# Patient Record
Sex: Female | Born: 1975 | Race: White | Hispanic: No | Smoking: Light tobacco smoker
Health system: Southern US, Community
[De-identification: ages and names within clinical notes are randomized; demographics above are authoritative.]

## PROBLEM LIST (undated history)

## (undated) DIAGNOSIS — R251 Tremor, unspecified: Secondary | ICD-10-CM

## (undated) DIAGNOSIS — N809 Endometriosis, unspecified: Secondary | ICD-10-CM

## (undated) DIAGNOSIS — T7840XA Allergy, unspecified, initial encounter: Secondary | ICD-10-CM

## (undated) DIAGNOSIS — F329 Major depressive disorder, single episode, unspecified: Secondary | ICD-10-CM

## (undated) DIAGNOSIS — A1801 Tuberculosis of spine: Secondary | ICD-10-CM

## (undated) DIAGNOSIS — Q7962 Hypermobile Ehlers-Danlos syndrome: Secondary | ICD-10-CM

## (undated) DIAGNOSIS — E282 Polycystic ovarian syndrome: Secondary | ICD-10-CM

## (undated) DIAGNOSIS — G43909 Migraine, unspecified, not intractable, without status migrainosus: Secondary | ICD-10-CM

## (undated) DIAGNOSIS — G4730 Sleep apnea, unspecified: Secondary | ICD-10-CM

## (undated) DIAGNOSIS — F909 Attention-deficit hyperactivity disorder, unspecified type: Secondary | ICD-10-CM

## (undated) DIAGNOSIS — E039 Hypothyroidism, unspecified: Secondary | ICD-10-CM

## (undated) DIAGNOSIS — L409 Psoriasis, unspecified: Secondary | ICD-10-CM

## (undated) DIAGNOSIS — G935 Compression of brain: Secondary | ICD-10-CM

## (undated) DIAGNOSIS — Z9889 Other specified postprocedural states: Secondary | ICD-10-CM

## (undated) DIAGNOSIS — R112 Nausea with vomiting, unspecified: Secondary | ICD-10-CM

## (undated) DIAGNOSIS — F32A Depression, unspecified: Secondary | ICD-10-CM

## (undated) DIAGNOSIS — I499 Cardiac arrhythmia, unspecified: Secondary | ICD-10-CM

## (undated) DIAGNOSIS — K219 Gastro-esophageal reflux disease without esophagitis: Secondary | ICD-10-CM

## (undated) DIAGNOSIS — R4701 Aphasia: Secondary | ICD-10-CM

## (undated) DIAGNOSIS — IMO0001 Reserved for inherently not codable concepts without codable children: Secondary | ICD-10-CM

## (undated) HISTORY — DX: Attention-deficit hyperactivity disorder, unspecified type: F90.9

## (undated) HISTORY — DX: Psoriasis, unspecified: L40.9

## (undated) HISTORY — DX: Endometriosis, unspecified: N80.9

## (undated) HISTORY — DX: Hypothyroidism, unspecified: E03.9

## (undated) HISTORY — PX: INNER EAR SURGERY: SHX679

## (undated) HISTORY — DX: Allergy, unspecified, initial encounter: T78.40XA

## (undated) HISTORY — PX: OTHER SURGICAL HISTORY: SHX169

## (undated) HISTORY — DX: Major depressive disorder, single episode, unspecified: F32.9

## (undated) HISTORY — DX: Depression, unspecified: F32.A

## (undated) HISTORY — PX: CRANIECTOMY: SHX331

## (undated) HISTORY — DX: Polycystic ovarian syndrome: E28.2

## (undated) HISTORY — PX: MYRINGOTOMY: SUR874

## (undated) HISTORY — DX: Migraine, unspecified, not intractable, without status migrainosus: G43.909

---
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
## 2000-05-03 ENCOUNTER — Emergency Department (HOSPITAL_COMMUNITY): Admission: EM | Admit: 2000-05-03 | Discharge: 2000-05-03 | Payer: Self-pay | Admitting: Emergency Medicine

## 2002-06-04 ENCOUNTER — Encounter: Payer: Self-pay | Admitting: Emergency Medicine

## 2002-06-04 ENCOUNTER — Emergency Department (HOSPITAL_COMMUNITY): Admission: EM | Admit: 2002-06-04 | Discharge: 2002-06-04 | Payer: Self-pay | Admitting: Emergency Medicine

## 2002-08-29 ENCOUNTER — Ambulatory Visit (HOSPITAL_COMMUNITY): Admission: RE | Admit: 2002-08-29 | Discharge: 2002-08-29 | Payer: Self-pay | Admitting: Gastroenterology

## 2002-09-27 ENCOUNTER — Other Ambulatory Visit: Admission: RE | Admit: 2002-09-27 | Discharge: 2002-09-27 | Payer: Self-pay | Admitting: *Deleted

## 2004-04-18 ENCOUNTER — Emergency Department (HOSPITAL_COMMUNITY): Admission: EM | Admit: 2004-04-18 | Discharge: 2004-04-18 | Payer: Self-pay | Admitting: Emergency Medicine

## 2005-05-01 ENCOUNTER — Encounter: Admission: RE | Admit: 2005-05-01 | Discharge: 2005-05-01 | Payer: Self-pay | Admitting: Otolaryngology

## 2005-08-04 ENCOUNTER — Encounter (INDEPENDENT_AMBULATORY_CARE_PROVIDER_SITE_OTHER): Payer: Self-pay | Admitting: *Deleted

## 2005-08-04 ENCOUNTER — Ambulatory Visit (HOSPITAL_COMMUNITY): Admission: RE | Admit: 2005-08-04 | Discharge: 2005-08-04 | Payer: Self-pay | Admitting: Gastroenterology

## 2005-09-10 ENCOUNTER — Encounter: Admission: RE | Admit: 2005-09-10 | Discharge: 2005-09-10 | Payer: Self-pay | Admitting: Family Medicine

## 2005-11-08 HISTORY — PX: MASTOIDECTOMY: SHX711

## 2006-02-10 ENCOUNTER — Ambulatory Visit: Payer: Self-pay | Admitting: Professional

## 2006-02-24 ENCOUNTER — Ambulatory Visit: Payer: Self-pay | Admitting: Professional

## 2006-03-03 ENCOUNTER — Ambulatory Visit: Payer: Self-pay | Admitting: Professional

## 2006-03-14 ENCOUNTER — Encounter: Admission: RE | Admit: 2006-03-14 | Discharge: 2006-03-14 | Payer: Self-pay | Admitting: Family Medicine

## 2006-03-17 ENCOUNTER — Ambulatory Visit: Payer: Self-pay | Admitting: Professional

## 2006-03-23 ENCOUNTER — Encounter (INDEPENDENT_AMBULATORY_CARE_PROVIDER_SITE_OTHER): Payer: Self-pay | Admitting: Specialist

## 2006-03-23 ENCOUNTER — Encounter: Admission: RE | Admit: 2006-03-23 | Discharge: 2006-03-23 | Payer: Self-pay | Admitting: Psychiatry

## 2006-03-24 ENCOUNTER — Ambulatory Visit: Payer: Self-pay | Admitting: Professional

## 2006-03-31 ENCOUNTER — Ambulatory Visit: Payer: Self-pay | Admitting: Professional

## 2006-04-21 ENCOUNTER — Ambulatory Visit: Payer: Self-pay | Admitting: Professional

## 2006-05-05 ENCOUNTER — Ambulatory Visit: Payer: Self-pay | Admitting: Professional

## 2006-05-19 ENCOUNTER — Ambulatory Visit: Payer: Self-pay | Admitting: Professional

## 2006-06-02 ENCOUNTER — Ambulatory Visit: Payer: Self-pay | Admitting: Professional

## 2006-06-20 ENCOUNTER — Ambulatory Visit: Payer: Self-pay | Admitting: Professional

## 2006-06-29 ENCOUNTER — Encounter: Admission: RE | Admit: 2006-06-29 | Discharge: 2006-06-29 | Payer: Self-pay | Admitting: Family Medicine

## 2006-07-21 ENCOUNTER — Ambulatory Visit: Payer: Self-pay | Admitting: Professional

## 2006-08-04 ENCOUNTER — Ambulatory Visit: Payer: Self-pay | Admitting: Professional

## 2006-08-18 ENCOUNTER — Ambulatory Visit: Payer: Self-pay | Admitting: Professional

## 2006-11-28 ENCOUNTER — Ambulatory Visit (HOSPITAL_COMMUNITY): Admission: RE | Admit: 2006-11-28 | Discharge: 2006-11-28 | Payer: Self-pay | Admitting: Gastroenterology

## 2007-02-24 ENCOUNTER — Encounter: Admission: RE | Admit: 2007-02-24 | Discharge: 2007-02-24 | Payer: Self-pay | Admitting: Family Medicine

## 2008-11-08 HISTORY — PX: MASTECTOMY, PARTIAL: SHX709

## 2008-11-08 HISTORY — PX: BREAST LUMPECTOMY: SHX2

## 2010-02-13 ENCOUNTER — Encounter: Admission: RE | Admit: 2010-02-13 | Discharge: 2010-02-13 | Payer: Self-pay | Admitting: Otolaryngology

## 2011-03-26 NOTE — Op Note (Signed)
   NAME:  Gina Golden, Gina Golden                          ACCOUNT NO.:  [id number]   MEDICAL RECORD NO.:  [id number]                   PATIENT TYPE:  AMB   LOCATION:  ENDO                                 FACILITY:  [facility]   PHYSICIAN:  Anselmo Rod, M.D.               DATE OF BIRTH:  1976/01/13   DATE OF PROCEDURE:  08/30/2002  DATE OF DISCHARGE:                                 OPERATIVE REPORT   PROCEDURE PERFORMED:  Screening colonoscopy.   ENDOSCOPIST:  Charna Elizabeth, M.D.   INSTRUMENT USED:  Olympus video colonoscope.   INDICATIONS FOR PROCEDURE:  The patient is a 35 year old white female with a  history of rectal bleeding, blood in mucus and stool, diarrhea and family  history of colon cancer in maternal great aunt.  Rule out inflammatory bowel  disease, polyps, etc.   PREPROCEDURE PREPARATION:  Informed consent was procured from the patient.  The patient was fasted for eight hours prior to the procedure and prepped  with a bottle of magnesium citrate and a gallon of NuLytely the night prior  to the procedure.   PREPROCEDURE PHYSICAL:  The patient had stable vital signs. Neck supple.  Chest clear to auscultation.  S1 and S2 regular.  Abdomen soft with normal  bowel sounds.   DESCRIPTION OF PROCEDURE:  The patient was placed in left lateral decubitus  position and sedated with 100 mg of Demerol and 10 mg of Versed  intravenously.  Once the patient was adequately sedated and maintained on  low flow oxygen and continuous cardiac monitoring, the Olympus video  colonoscope was advanced from the rectum to the cecum and terminal ileum  without difficulty.  The patient had a fairly good prep, no masses, polyps,  erosions, ulcerations or diverticula seen.  There was no evidence of  inflammatory bowel disease.  The appendicular orifice and ileocecal valve  were clearly visualized and photographed.  The terminal ileum appeared  normal as well.   IMPRESSION:  Normal colonoscopy to the  terminal ileum.    RECOMMENDATIONS:  1. A high fiber diet has been recommended to the patient.  2. Outpatient follow-up is advised in the next two weeks.  A small bowel     follow-through will be done if the patient's symptoms persist.  Further     recommendations will be made thereafter.                                                  Anselmo Rod, M.D.    JNM/MEDQ  D:  08/30/2002  T:  08/30/2002  Job:  [id number]   cc:   Gabriel Earing, MD  [address]  [place]  [place] [address]  Fax: [phone]

## 2011-03-26 NOTE — Op Note (Signed)
Gina Golden, Gina Golden                ACCOUNT NO.:  [id number]   MEDICAL RECORD NO.:  [id number]          PATIENT TYPE:  AMB   LOCATION:  ENDO                         FACILITY:  [facility]   PHYSICIAN:  Anselmo Rod, M.D.  DATE OF BIRTH:  04/03/1976   DATE OF PROCEDURE:  08/04/2005  DATE OF DISCHARGE:                                 OPERATIVE REPORT   PROCEDURE PERFORMED:  Esophagogastroduodenoscopy with antral biopsies.   ENDOSCOPIST:  Charna Elizabeth, M.D.   INSTRUMENT USED:  Olympus video panendoscope.   INDICATIONS FOR PROCEDURE:  The patient is a 35 year old white female with a  history of multiple medical issues including PCOS, anxiety and depression,  undergoing esophagogastroduodenoscopy for nausea and abdominal pain.  Patient was found to have guaiac positive stools in the office rule out  peptic ulcer disease.   PREPROCEDURE PREPARATION:  Informed consent was procured from the patient.  The patient was fasted for eight hours prior to the procedure.   PREPROCEDURE PHYSICAL:  The patient had stable vital signs.  Neck supple,  chest clear to auscultation.  S1, S2 regular.  Abdomen soft with normal  bowel sounds.   DESCRIPTION OF PROCEDURE:  The patient was placed in the left lateral  decubitus position and sedated with 60 mg of Demerol and 10 mg of Versed in  slow incremental doses.  Once the patient was adequately sedated and  maintained on low-flow oxygen and continuous cardiac monitoring, the Olympus  video panendoscope was advanced through the mouth piece over the tongue into  the esophagus under direct vision.  The entire esophagus appeared normal  with no evidence of ring, stricture, masses, esophagitis or Barrett's  mucosa.  The scope was then advanced to the stomach.  A small hiatal hernia  was seen on high retroflexion.  Antral erosions were noted.  Biopsies were  done to right upper lobe presence of Helicobacter pylori by pathology.  Proximal small bowel appeared  normal.  There was no outlet obstruction.  There was some debris in the stomach along the high cardia which may  indicate a component of gastroparesis.   IMPRESSION:  1.  Normal-appearing esophagus.  2.  Small hiatal hernia.  3.  Antral erosions.  Biopsies done for Helicobacter pylori by pathology.  4.  Some debris in the stomach along the greater curvature question      gastroparesis.  5.  Normal proximal small bowel.   RECOMMENDATIONS:  Trial of proton pump inhibitor has been advised.  If the  patient is not better with that, a gastric emptying study will be done,  further recommendations made thereafter.      Anselmo Rod, M.D.  Electronically Signed     JNM/MEDQ  D:  08/04/2005  T:  08/05/2005  Job:  [id number]

## 2011-07-05 DIAGNOSIS — F3189 Other bipolar disorder: Secondary | ICD-10-CM | POA: Insufficient documentation

## 2011-07-05 DIAGNOSIS — F909 Attention-deficit hyperactivity disorder, unspecified type: Secondary | ICD-10-CM | POA: Insufficient documentation

## 2012-01-13 ENCOUNTER — Other Ambulatory Visit: Payer: Self-pay | Admitting: *Deleted

## 2012-01-13 DIAGNOSIS — N83209 Unspecified ovarian cyst, unspecified side: Secondary | ICD-10-CM

## 2012-02-10 ENCOUNTER — Ambulatory Visit (INDEPENDENT_AMBULATORY_CARE_PROVIDER_SITE_OTHER): Payer: Self-pay | Admitting: Obstetrics & Gynecology

## 2012-02-10 ENCOUNTER — Other Ambulatory Visit: Payer: Self-pay | Admitting: Obstetrics and Gynecology

## 2012-02-10 ENCOUNTER — Ambulatory Visit (HOSPITAL_COMMUNITY)
Admission: RE | Admit: 2012-02-10 | Discharge: 2012-02-10 | Disposition: A | Discharge status: Home / Self Care | Payer: Self-pay | Source: Ambulatory Visit | Attending: Obstetrics & Gynecology | Admitting: Obstetrics & Gynecology

## 2012-02-10 ENCOUNTER — Ambulatory Visit
Admission: RE | Admit: 2012-02-10 | Discharge: 2012-02-10 | Disposition: A | Discharge status: Home / Self Care | Payer: No Typology Code available for payment source | Source: Ambulatory Visit | Attending: Obstetrics and Gynecology | Admitting: Obstetrics and Gynecology

## 2012-02-10 ENCOUNTER — Encounter: Payer: Self-pay | Admitting: Obstetrics & Gynecology

## 2012-02-10 VITALS — BP 134/88 | HR 106 | Temp 97.7°F | Ht 65.0 in | Wt 219.4 lb

## 2012-02-10 DIAGNOSIS — Z1239 Encounter for other screening for malignant neoplasm of breast: Secondary | ICD-10-CM

## 2012-02-10 DIAGNOSIS — N83209 Unspecified ovarian cyst, unspecified side: Secondary | ICD-10-CM

## 2012-02-10 DIAGNOSIS — N63 Unspecified lump in unspecified breast: Secondary | ICD-10-CM

## 2012-02-10 DIAGNOSIS — A7489 Other chlamydial diseases: Secondary | ICD-10-CM

## 2012-02-10 DIAGNOSIS — Z30431 Encounter for routine checking of intrauterine contraceptive device: Secondary | ICD-10-CM | POA: Insufficient documentation

## 2012-02-10 DIAGNOSIS — A749 Chlamydial infection, unspecified: Secondary | ICD-10-CM

## 2012-02-10 NOTE — Progress Notes (Signed)
Complaints of right breast lump and tenderness.   Pap Smear:    Pap smear not performed today. Patients last Pap smear was 01/03/12 at the free Pap smear screening at the [facility] and was normal. Per patient she has a history of an abnormal Pap smear that required a LEEP in 2006 or 2007. No Pap smear results in EPIC.  Physical exam: Breasts Right breast is larger than left breast. Patient has a history of a partial mastectomy in left breast related to a benign lump and a lumpectomy in the right breast from a benign lump. No skin abnormalities bilateral breasts. No nipple retraction bilateral breasts. No nipple discharge bilateral breasts. No lymphadenopathy. No lumps palpated left breast. Palpated lump in the right breast at 9 o'clock. Patient complained of tenderness when palpated lump. Patient referred to the [facility] for Diagnostic Mammogram and possible right breast ultrasound. Patient will be worked in at the [facility] [employer] following this appointment.         Pelvic/Bimanual No Pap smear completed today since last Pap smear was 01/03/12 and normal. Pap smear not indicated per BCCCP guidelines.

## 2012-02-10 NOTE — Progress Notes (Signed)
Addended by: Allie Bossier on: 02/10/2012 01:52 PM   Modules accepted: Orders

## 2012-02-10 NOTE — Progress Notes (Addendum)
  Subjective:    Patient ID: Gina Golden, female    DOB: 1976/09/16, 36 y.o.   MRN: [id number]  HPI  Gina Golden was seen for a pap smear at the [facility]. She gave a history of having an ovarian cyst seen on a CT urogram. An u/s done today was normal. She also tells me she was diagnosed with chlamydia in 11/12 but has not had a TOC. The spotting she has had recently is not normal for her (She had amenorrhea with her IUD for the first 2 years). Review of Systems Pap smear was normal    Objective:   Physical Exam        Assessment & Plan:  Normal u/s RTC prn

## 2012-02-15 ENCOUNTER — Other Ambulatory Visit: Payer: Self-pay

## 2012-02-22 ENCOUNTER — Telehealth: Payer: Self-pay

## 2012-02-22 NOTE — Telephone Encounter (Signed)
Called pt and clarified with pt of finding of mass in the right breast.  Pt confirmed.  I informed that I was calling to make sure that she calls and schedules a diagnostic mammogram around October for follow up of the mass.  I gave pt the # to the [facility] and pt informed me that she would call to schedule her appt. Pt asked about her results of her urine in which I informed her of negative results.  Pt had no further questions and stated understanding.

## 2012-03-06 DIAGNOSIS — N63 Unspecified lump in unspecified breast: Secondary | ICD-10-CM | POA: Insufficient documentation

## 2012-04-06 DIAGNOSIS — H6690 Otitis media, unspecified, unspecified ear: Secondary | ICD-10-CM | POA: Insufficient documentation

## 2012-07-27 ENCOUNTER — Telehealth (HOSPITAL_COMMUNITY): Payer: Self-pay | Admitting: *Deleted

## 2012-07-27 NOTE — Telephone Encounter (Signed)
Telephoned home # and could not be completed as dialed. Telephoned mobile # and not available.

## 2012-08-18 ENCOUNTER — Other Ambulatory Visit: Payer: Self-pay | Admitting: Family Medicine

## 2012-08-18 DIAGNOSIS — N63 Unspecified lump in unspecified breast: Secondary | ICD-10-CM

## 2012-09-05 ENCOUNTER — Ambulatory Visit
Admission: RE | Admit: 2012-09-05 | Discharge: 2012-09-05 | Disposition: A | Discharge status: Home / Self Care | Payer: No Typology Code available for payment source | Source: Ambulatory Visit | Attending: Family Medicine | Admitting: Family Medicine

## 2012-09-05 ENCOUNTER — Other Ambulatory Visit: Payer: Self-pay | Admitting: Family Medicine

## 2012-09-05 DIAGNOSIS — N63 Unspecified lump in unspecified breast: Secondary | ICD-10-CM

## 2012-10-09 DIAGNOSIS — E282 Polycystic ovarian syndrome: Secondary | ICD-10-CM | POA: Insufficient documentation

## 2013-05-04 ENCOUNTER — Telehealth (HOSPITAL_COMMUNITY): Payer: Self-pay | Admitting: *Deleted

## 2013-05-04 NOTE — Telephone Encounter (Signed)
Telephoned patient at home # and left message to return call to BCCCP 

## 2013-06-29 ENCOUNTER — Telehealth (HOSPITAL_COMMUNITY): Payer: Self-pay | Admitting: *Deleted

## 2013-06-29 NOTE — Telephone Encounter (Signed)
Telephoned home # and mobile # not a working #.

## 2013-07-02 ENCOUNTER — Encounter (HOSPITAL_COMMUNITY): Payer: Self-pay | Admitting: *Deleted

## 2013-08-03 ENCOUNTER — Encounter: Payer: Self-pay | Admitting: Obstetrics & Gynecology

## 2013-09-05 ENCOUNTER — Ambulatory Visit: Payer: Self-pay | Admitting: Obstetrics & Gynecology

## 2013-09-10 ENCOUNTER — Ambulatory Visit (INDEPENDENT_AMBULATORY_CARE_PROVIDER_SITE_OTHER): Payer: 59 | Admitting: Obstetrics & Gynecology

## 2013-09-10 ENCOUNTER — Encounter: Payer: Self-pay | Admitting: Obstetrics & Gynecology

## 2013-09-10 VITALS — BP 127/83 | HR 103 | Temp 99.2°F | Ht 65.0 in | Wt 212.0 lb

## 2013-09-10 DIAGNOSIS — L409 Psoriasis, unspecified: Secondary | ICD-10-CM | POA: Insufficient documentation

## 2013-09-10 DIAGNOSIS — N921 Excessive and frequent menstruation with irregular cycle: Secondary | ICD-10-CM

## 2013-09-10 DIAGNOSIS — Z9012 Acquired absence of left breast and nipple: Secondary | ICD-10-CM

## 2013-09-10 DIAGNOSIS — Z9010 Acquired absence of unspecified breast and nipple: Secondary | ICD-10-CM | POA: Insufficient documentation

## 2013-09-10 DIAGNOSIS — N926 Irregular menstruation, unspecified: Secondary | ICD-10-CM

## 2013-09-10 DIAGNOSIS — N809 Endometriosis, unspecified: Secondary | ICD-10-CM | POA: Insufficient documentation

## 2013-09-10 DIAGNOSIS — Z01419 Encounter for gynecological examination (general) (routine) without abnormal findings: Secondary | ICD-10-CM | POA: Insufficient documentation

## 2013-09-10 LAB — POCT URINE PREGNANCY: Preg Test, Ur: NEGATIVE

## 2013-09-10 NOTE — Progress Notes (Signed)
Subjective:     Gina Golden is a 37 y.o. female here for a routine exam.  Current complaints: has had IUD since April 2010, started having vaginal bleeding last month, would like to have  STD and HIV testing done today Personal health questionnaire reviewed: yes.   Gynecologic History No LMP recorded. Patient is not currently having periods (Reason: IUD). Contraception: condoms and IUD Last Pap: . Results were: normal Last mammogram: 11/13. Results were: normal  Obstetric History OB History  No data available     The following portions of the patient's history were reviewed and updated as appropriate: allergies, current medications, past family history, past medical history, past social history, past surgical history and problem list.  Review of Systems Pertinent items are noted in HPI.    Objective:    General appearance: alert Breasts: normal appearance, no masses or tenderness Abdomen: soft, non-tender; bowel sounds normal; no masses,  no organomegaly Pelvic: cervix normal in appearance, IUD strings present, external genitalia normal, no adnexal masses or tenderness, uterus normal size, shape, and consistency, slightly tender and vagina normal without discharge     3-D pelvic U/S:  IUD appears appropriately positioned without myometrial penetration; the right ovary is normal; the left ovary was not seen Assessment:  Unscheduled bleeding/pain with Mirena IUD The IUD appears to be appropriately positioned DDx--possible endometritis--the pt is s/p a course of antibiotics  H/O endometriosis Plan:    Check Pap smear, cervical cultures If persistent abnormal bleeding may need an endometrial biopsy F/U prn or in 6 mths

## 2013-09-12 LAB — PAP IG AND HPV HIGH-RISK: HPV DNA High Risk: DETECTED — AB

## 2013-09-13 ENCOUNTER — Other Ambulatory Visit: Payer: Self-pay

## 2013-09-15 ENCOUNTER — Encounter: Payer: Self-pay | Admitting: Obstetrics & Gynecology

## 2013-09-15 DIAGNOSIS — R87810 Cervical high risk human papillomavirus (HPV) DNA test positive: Secondary | ICD-10-CM | POA: Insufficient documentation

## 2013-09-16 ENCOUNTER — Other Ambulatory Visit: Payer: Self-pay | Admitting: *Deleted

## 2013-09-16 DIAGNOSIS — Z30431 Encounter for routine checking of intrauterine contraceptive device: Secondary | ICD-10-CM

## 2013-09-19 ENCOUNTER — Other Ambulatory Visit: Payer: 59

## 2014-01-16 ENCOUNTER — Ambulatory Visit: Payer: 59 | Admitting: Obstetrics & Gynecology

## 2014-03-25 ENCOUNTER — Ambulatory Visit: Payer: 59 | Admitting: Obstetrics & Gynecology

## 2014-03-28 ENCOUNTER — Ambulatory Visit (INDEPENDENT_AMBULATORY_CARE_PROVIDER_SITE_OTHER): Payer: 59 | Admitting: Obstetrics & Gynecology

## 2014-03-28 ENCOUNTER — Encounter: Payer: Self-pay | Admitting: Obstetrics & Gynecology

## 2014-03-28 VITALS — BP 162/98 | HR 122 | Temp 98.2°F | Ht 65.0 in | Wt 216.0 lb

## 2014-03-28 DIAGNOSIS — Z113 Encounter for screening for infections with a predominantly sexual mode of transmission: Secondary | ICD-10-CM

## 2014-03-29 ENCOUNTER — Other Ambulatory Visit: Payer: Self-pay | Admitting: *Deleted

## 2014-03-29 DIAGNOSIS — N760 Acute vaginitis: Principal | ICD-10-CM

## 2014-03-29 DIAGNOSIS — B9689 Other specified bacterial agents as the cause of diseases classified elsewhere: Secondary | ICD-10-CM

## 2014-03-29 LAB — WET PREP BY MOLECULAR PROBE
CANDIDA SPECIES: NEGATIVE
Gardnerella vaginalis: POSITIVE — AB
Trichomonas vaginosis: NEGATIVE

## 2014-03-29 LAB — GC/CHLAMYDIA PROBE AMP
CT PROBE, AMP APTIMA: NEGATIVE
GC PROBE AMP APTIMA: NEGATIVE

## 2014-03-29 MED ORDER — METRONIDAZOLE 500 MG PO TABS: 500.0000 mg | ORAL_TABLET | Freq: Two times a day (BID) | ORAL | Status: DC

## 2014-03-30 LAB — POCT WET PREP (WET MOUNT): Clue Cells Wet Prep Whiff POC: POSITIVE

## 2014-03-30 NOTE — Progress Notes (Signed)
Patient ID: Gina Golden, female   DOB: 29-Jun-1976, 38 y.o.   MRN: [id number]  Chief Complaint  Patient presents with  . Vaginitis    HPI Gina Golden is a 38 y.o. female.   Vaginal Discharge The patient's primary symptoms include a genital odor and a vaginal discharge. This is a new problem. The current episode started 1 to 4 weeks ago. The problem has been unchanged. The patient is experiencing no pain. She is not pregnant. Pertinent negatives include no dysuria, painful intercourse or urgency. She has tried nothing for the symptoms. She is sexually active. It is unknown whether or not her partner has an STD.    Past Medical History  Diagnosis Date  . PCOS (polycystic ovarian syndrome)   . Hypothyroidism   . Depression   . Adult ADHD   . Allergy   . Endometriosis   . Psoriasis     Past Surgical History  Procedure Laterality Date  . Mastoidectomy  2007  . Breast lumpectomy  2010    right  . Mastectomy, partial  2010    left  . Adenoidectomy    . Laprosc      Family History  Problem Relation Age of Onset  . Cancer Father   . Heart disease Mother   . Heart disease Maternal Grandmother     Social History History  Substance Use Topics  . Smoking status: Current Every Day Smoker    Types: Cigarettes    Start date: 11/08/2005  . Smokeless tobacco: Never Used  . Alcohol Use: 1.2 oz/week    2 Shots of liquor per week    Allergies  Allergen Reactions  . Effexor [Venlafaxine Hydrochloride] Other (See Comments)    seizures  . Sulfa Drugs Cross Reactors Swelling  . Tetracyclines & Related Shortness Of Breath  . Venlafaxine Anaphylaxis  . Ceclor [Cefaclor] Rash      Review of Systems Review of Systems  Genitourinary: Positive for vaginal discharge. Negative for dysuria and urgency.   Constitutional: negative for fatigue and weight loss Respiratory: negative for cough and wheezing Cardiovascular: negative for chest pain, fatigue and  palpitations Gastrointestinal: negative for abdominal pain and change in bowel habits Musculoskeletal:negative for myalgias Neurological: negative for gait problems and tremors Behavioral/Psych: negative for abusive relationship, depression Endocrine: negative for temperature intolerance     Blood pressure 162/98, pulse 122, temperature 98.2 F (36.8 C), height 5\' 5"  (1.651 m), weight 97.977 kg (216 lb).  Physical Exam Physical Exam General:   alert  Skin:   no rash or abnormalities  Lungs:   clear to auscultation bilaterally  Heart:   regular rate and rhythm, S1, S2 normal, no murmur, click, rub or gallop  Abdomen:  normal findings: no organomegaly, soft, non-tender and no hernia  Pelvis:  External genitalia: normal general appearance Urinary system: urethral meatus normal and bladder without fullness, nontender Vaginal: white, thin discharge Cervix: normal appearance; IUD strings present Adnexa: normal bimanual exam Uterus: anteverted and non-tender, normal size      Data Reviewed None  Assessment    Bacterial vaginosis Requests STI testing     Plan    Orders Placed This Encounter  Procedures  . WET PREP BY MOLECULAR PROBE  . GC/Chlamydia Probe Amp  . POCT Wet Prep [facility])   Meds ordered this encounter  Medications  . topiramate (TOPAMAX) 50 MG tablet    Sig: Take 50 mg by mouth 2 (two) times daily.  . clobetasol ointment (TEMOVATE) 0.05 %  Sig: Apply 1 application topically 2 (two) times daily.  . betamethasone, augmented, (DIPROLENE) 0.05 % gel    Sig: Apply topically.  . Cholecalciferol (VITAMIN D) 2000 UNITS tablet    Sig: Take 2,000 Units by mouth.     Follow up for IUD removal         Lahoma Crocker 03/30/2014, 1:50 PM

## 2014-03-30 NOTE — Patient Instructions (Signed)
Bacterial Vaginosis Bacterial vaginosis is a vaginal infection that occurs when the normal balance of bacteria in the vagina is disrupted. It results from an overgrowth of certain bacteria. This is the most common vaginal infection in women of childbearing age. Treatment is important to prevent complications, especially in pregnant women, as it can cause a premature delivery. CAUSES  Bacterial vaginosis is caused by an increase in harmful bacteria that are normally present in smaller amounts in the vagina. Several different kinds of bacteria can cause bacterial vaginosis. However, the reason that the condition develops is not fully understood. RISK FACTORS Certain activities or behaviors can put you at an increased risk of developing bacterial vaginosis, including:  Having a new sex partner or multiple sex partners.  Douching.  Using an intrauterine device (IUD) for contraception. Women do not get bacterial vaginosis from toilet seats, bedding, swimming pools, or contact with objects around them. SIGNS AND SYMPTOMS  Some women with bacterial vaginosis have no signs or symptoms. Common symptoms include:  Grey vaginal discharge.  A fishlike odor with discharge, especially after sexual intercourse.  Itching or burning of the vagina and vulva.  Burning or pain with urination. DIAGNOSIS  Your health care provider will take a medical history and examine the vagina for signs of bacterial vaginosis. A sample of vaginal fluid may be taken. Your health care provider will look at this sample under a microscope to check for bacteria and abnormal cells. A vaginal pH test may also be done.  TREATMENT  Bacterial vaginosis may be treated with antibiotic medicines. These may be given in the form of a pill or a vaginal cream. A second round of antibiotics may be prescribed if the condition comes back after treatment.  HOME CARE INSTRUCTIONS   Only take over-the-counter or prescription medicines as  directed by your health care provider.  If antibiotic medicine was prescribed, take it as directed. Make sure you finish it even if you start to feel better.  Do not have sex until treatment is completed.  Tell all sexual partners that you have a vaginal infection. They should see their health care provider and be treated if they have problems, such as a mild rash or itching.  Practice safe sex by using condoms and only having one sex partner. SEEK MEDICAL CARE IF:   Your symptoms are not improving after 3 days of treatment.  You have increased discharge or pain.  You have a fever. MAKE SURE YOU:   Understand these instructions.  Will watch your condition.  Will get help right away if you are not doing well or get worse. FOR MORE INFORMATION  Centers for Disease Control and Prevention, Division of STD Prevention: Appraiser[employer]raud.fi American Sexual Health Association (ASHA): www.ashastd.org  Document Released: 10/25/2005 Document Revised: 08/15/2013 Document Reviewed: 06/06/2013 [facility] Patient Information 2014 [place]. Safe Sex Safe sex is about reducing the risk of giving or getting a sexually transmitted disease (STD). STDs are spread through sexual contact involving the genitals, mouth, or rectum. Some STDS can be cured and others cannot. Safe sex can also prevent unintended pregnancies.  SAFE SEX PRACTICES  Limit your sexual activity to only one partner who is only having sex with you.  Talk to your partner about their past partners, past STDs, and drug use.  Use a condom every time you have sexual intercourse. This includes vaginal, oral, and anal sexual activity. Both females and males should wear condoms during oral sex. Only use latex or polyurethane condoms and  water-based lubricants. Petroleum-based lubricants or oils used to lubricate a condom will weaken the condom and increase the chance that it will break. The condom should be in place from the beginning to the  end of sexual activity. Wearing a condom reduces, but does not completely eliminate, your risk of getting or giving a STD. STDs can be spread by contact with skin of surrounding areas.  Get vaccinated for hepatitis B and HPV.  Avoid alcohol and recreational drugs which can affect your judgement. You may forget to use a condom or participate in high-risk sex.  For females, avoid douching after sexual intercourse. Douching can spread an infection farther into the reproductive tract.  Check your body for signs of sores, blisters, rashes, or unusual discharge. See your caregiver if you notice any of these signs.  Avoid sexual contact if you have symptoms of an infection or are being treated for an STD. If you or your partner has herpes, avoid sexual contact when blisters are present. Use condoms at all other times.  See your caregiver for regular screenings, examinations, and tests for STDs. Before having sex with a new partner, each of you should be screened for STDs and talk about the results with your partner. BENEFITS OF SAFE SEX   There is less of a chance of getting or giving an STD.  You can prevent unwanted or unintended pregnancies.  By discussing safer sex concerns with your partner, you may increase feelings of intimacy, comfort, trust, and honesty between the both of you. Document Released: 12/02/2004 Document Revised: 07/19/2012 Document Reviewed: 04/17/2012 [facility] Patient Information 2014 [place].

## 2014-04-03 ENCOUNTER — Ambulatory Visit: Payer: 59 | Admitting: Obstetrics & Gynecology

## 2014-04-25 ENCOUNTER — Ambulatory Visit (INDEPENDENT_AMBULATORY_CARE_PROVIDER_SITE_OTHER): Payer: 59 | Admitting: Obstetrics & Gynecology

## 2014-04-25 ENCOUNTER — Encounter: Payer: Self-pay | Admitting: Obstetrics & Gynecology

## 2014-04-25 VITALS — BP 125/85 | HR 86 | Temp 98.3°F | Wt 213.0 lb

## 2014-04-25 DIAGNOSIS — Z30432 Encounter for removal of intrauterine contraceptive device: Secondary | ICD-10-CM | POA: Diagnosis not present

## 2014-04-25 DIAGNOSIS — Z30430 Encounter for insertion of intrauterine contraceptive device: Secondary | ICD-10-CM

## 2014-04-25 DIAGNOSIS — Z3202 Encounter for pregnancy test, result negative: Secondary | ICD-10-CM

## 2014-04-25 DIAGNOSIS — Z01818 Encounter for other preprocedural examination: Secondary | ICD-10-CM

## 2014-04-25 LAB — POCT URINE PREGNANCY: Preg Test, Ur: NEGATIVE

## 2014-04-25 MED ORDER — LEVONORGESTREL 20 MCG/24HR IU IUD
INTRAUTERINE_SYSTEM | Freq: Once | INTRAUTERINE | Status: AC
  Administered 2014-04-25: 11:00:00 via INTRAUTERINE

## 2014-04-26 ENCOUNTER — Encounter: Payer: Self-pay | Admitting: Obstetrics & Gynecology

## 2014-04-26 NOTE — Progress Notes (Signed)
IUD Insertion Procedure Note  Pre-operative Diagnosis: Mirena insitu x 5 yrs; requests removal/reinsertion  Post-operative Diagnosis: same  Indications: contraception  Procedure Details  The risks (including infection, bleeding, pain, and uterine perforation) and benefits of the procedure were explained to the patient and Written informed consent was obtained.    The IUD strings were grasped and the IUD was removed intact.  The cervix was cleansed with Betadine. The uterus sounded to 7 cm. IUD inserted without difficulty. String visible and trimmed. Patient tolerated procedure well.  IUD Information: Mirena.  Condition: Stable  Complications: None  Plan:  The patient was advised to call for any fever or for prolonged or severe pain or bleeding. She was advised to use OTC acetaminophen as needed for mild to moderate pain.

## 2014-04-29 ENCOUNTER — Telehealth: Payer: Self-pay | Admitting: *Deleted

## 2014-04-29 DIAGNOSIS — B9689 Other specified bacterial agents as the cause of diseases classified elsewhere: Secondary | ICD-10-CM

## 2014-04-29 DIAGNOSIS — N760 Acute vaginitis: Principal | ICD-10-CM

## 2014-04-29 MED ORDER — METRONIDAZOLE 500 MG PO TABS: 500.0000 mg | ORAL_TABLET | Freq: Two times a day (BID) | ORAL | Status: DC

## 2014-04-29 NOTE — Telephone Encounter (Signed)
Patient states she is having what she believes is a bacterial infection. Patient states she is having a vaginal odor. Per nursing protocol prescription sent to the pharmacy.   Patient states she is having recurrent bacterial infections after having intercourse with her partner. Patient states she is only having intercourse with one partner. Patient would like to know what she can do to help prevent the recurrent BV. Pleas advise.

## 2014-05-01 NOTE — Telephone Encounter (Signed)
Offer appointment to discuss suppressive Rx

## 2014-05-27 ENCOUNTER — Telehealth: Payer: Self-pay | Admitting: Hematology & Oncology

## 2014-05-27 NOTE — Telephone Encounter (Signed)
Pt called demanding that I schedule appointment for her. She said Dr. Wilma Flavin [phone] has sent the referral. She is aware I have not received referral.I advised her to call referring. She insisted it was me who needed to call them. I took the name and number and called referring talked to [facility]. She said she would send referral and that the MD just put it in at 1402.  The nurse faxed referral but you can't read past labs and there are no office notes, she is aware and will refax everything. Pt is aware they will fax me the chart asap. She is aware as soon as I get the referral I will give it to MD for review. She wanted to know how long that would take. I told her Dr. Marin Olp will review it same day but he would tell me when to schedule after he reviews it. She said she would be dead in a week I suggested she call her MD that they are the ones taking care of her. She said she would.

## 2014-05-29 ENCOUNTER — Telehealth: Payer: Self-pay | Admitting: Hematology & Oncology

## 2014-05-29 NOTE — Telephone Encounter (Signed)
Per Md and Rn Nikki C to sch New Pt apt for this week.  Apt was sch for 05/30/14.  Patient was called and give apt date and time.  She stated she will be here

## 2014-05-29 NOTE — Telephone Encounter (Signed)
Spoke w NEW PATIENT today to remind them of their appointment with Dr. Ennever. Also, advised them to bring all medication bottles and insurance card information. ° °

## 2014-05-30 ENCOUNTER — Other Ambulatory Visit: Payer: BC Managed Care – PPO | Admitting: Lab

## 2014-05-30 ENCOUNTER — Ambulatory Visit: Payer: BC Managed Care – HMO

## 2014-05-30 ENCOUNTER — Ambulatory Visit (HOSPITAL_BASED_OUTPATIENT_CLINIC_OR_DEPARTMENT_OTHER): Payer: 59 | Admitting: Lab

## 2014-05-30 ENCOUNTER — Ambulatory Visit (HOSPITAL_BASED_OUTPATIENT_CLINIC_OR_DEPARTMENT_OTHER): Payer: BC Managed Care – HMO | Admitting: Family

## 2014-05-30 VITALS — BP 142/96 | HR 95 | Temp 98.2°F | Resp 14 | Ht 65.0 in | Wt 216.0 lb

## 2014-05-30 DIAGNOSIS — F17200 Nicotine dependence, unspecified, uncomplicated: Secondary | ICD-10-CM

## 2014-05-30 DIAGNOSIS — R05 Cough: Secondary | ICD-10-CM

## 2014-05-30 DIAGNOSIS — E034 Atrophy of thyroid (acquired): Secondary | ICD-10-CM

## 2014-05-30 DIAGNOSIS — T732XXS Exhaustion due to exposure, sequela: Secondary | ICD-10-CM

## 2014-05-30 DIAGNOSIS — D510 Vitamin B12 deficiency anemia due to intrinsic factor deficiency: Secondary | ICD-10-CM

## 2014-05-30 DIAGNOSIS — M810 Age-related osteoporosis without current pathological fracture: Secondary | ICD-10-CM

## 2014-05-30 DIAGNOSIS — R161 Splenomegaly, not elsewhere classified: Secondary | ICD-10-CM

## 2014-05-30 DIAGNOSIS — D72829 Elevated white blood cell count, unspecified: Secondary | ICD-10-CM

## 2014-05-30 DIAGNOSIS — R5381 Other malaise: Secondary | ICD-10-CM

## 2014-05-30 DIAGNOSIS — E039 Hypothyroidism, unspecified: Secondary | ICD-10-CM

## 2014-05-30 DIAGNOSIS — R053 Chronic cough: Secondary | ICD-10-CM

## 2014-05-30 DIAGNOSIS — R5383 Other fatigue: Secondary | ICD-10-CM

## 2014-05-30 LAB — CBC WITH DIFFERENTIAL (CANCER CENTER ONLY)
BASO#: 0 10*3/uL (ref 0.0–0.2)
BASO%: 0.3 % (ref 0.0–2.0)
EOS ABS: 0.5 10*3/uL (ref 0.0–0.5)
EOS%: 3.7 % (ref 0.0–7.0)
HCT: 43.8 % (ref 34.8–46.6)
HEMOGLOBIN: 15.2 g/dL (ref 11.6–15.9)
LYMPH#: 4.6 10*3/uL — ABNORMAL HIGH (ref 0.9–3.3)
LYMPH%: 36.8 % (ref 14.0–48.0)
MCH: 29.5 pg (ref 26.0–34.0)
MCHC: 34.7 g/dL (ref 32.0–36.0)
MCV: 85 fL (ref 81–101)
MONO#: 1 10*3/uL — ABNORMAL HIGH (ref 0.1–0.9)
MONO%: 8 % (ref 0.0–13.0)
NEUT#: 6.4 10*3/uL (ref 1.5–6.5)
NEUT%: 51.2 % (ref 39.6–80.0)
Platelets: 352 10*3/uL (ref 145–400)
RBC: 5.16 10*6/uL (ref 3.70–5.32)
RDW: 14 % (ref 11.1–15.7)
WBC: 12.6 10*3/uL — ABNORMAL HIGH (ref 3.9–10.0)

## 2014-05-30 LAB — TSH CHCC: TSH: 1.122 m(IU)/L (ref 0.308–3.960)

## 2014-05-30 LAB — IRON AND TIBC CHCC
%SAT: 46 % (ref 21–57)
Iron: 167 ug/dL — ABNORMAL HIGH (ref 41–142)
TIBC: 361 ug/dL (ref 236–444)
UIBC: 194 ug/dL (ref 120–384)

## 2014-05-30 LAB — FERRITIN CHCC: Ferritin: 85 ng/ml (ref 9–269)

## 2014-05-30 LAB — CHCC SATELLITE - SMEAR

## 2014-05-31 ENCOUNTER — Other Ambulatory Visit: Payer: No Typology Code available for payment source | Admitting: Lab

## 2014-05-31 ENCOUNTER — Encounter: Payer: Self-pay | Admitting: *Deleted

## 2014-05-31 LAB — COMPREHENSIVE METABOLIC PANEL
ALBUMIN: 4.4 g/dL (ref 3.5–5.2)
ALT: 20 U/L (ref 0–35)
AST: 15 U/L (ref 0–37)
Alkaline Phosphatase: 67 U/L (ref 39–117)
BUN: 7 mg/dL (ref 6–23)
CO2: 19 mEq/L (ref 19–32)
Calcium: 9.5 mg/dL (ref 8.4–10.5)
Chloride: 105 mEq/L (ref 96–112)
Creatinine, Ser: 0.54 mg/dL (ref 0.50–1.10)
GLUCOSE: 99 mg/dL (ref 70–99)
POTASSIUM: 4.5 meq/L (ref 3.5–5.3)
Sodium: 137 mEq/L (ref 135–145)
Total Bilirubin: 0.5 mg/dL (ref 0.2–1.2)
Total Protein: 6.9 g/dL (ref 6.0–8.3)

## 2014-05-31 LAB — VITAMIN B12: Vitamin B-12: 361 pg/mL (ref 211–911)

## 2014-05-31 LAB — VITAMIN D 25 HYDROXY (VIT D DEFICIENCY, FRACTURES): Vit D, 25-Hydroxy: 44 ng/mL (ref 30–89)

## 2014-05-31 LAB — LACTATE DEHYDROGENASE: LDH: 198 U/L (ref 94–250)

## 2014-05-31 NOTE — Progress Notes (Signed)
Hematology/Oncology Consultation   Name: Gina Golden      MRN: [id number]    Location: Room/bed info not found  Date: 05/31/2014 Time:8:45 AM   REFERRING PHYSICIAN:  Dr. Wilma Flavin  REASON FOR CONSULT:  Leukocytosis   DIAGNOSIS:  Leukocytosis  HISTORY OF PRESENT ILLNESS:  Pt states that she began having ear infections in March but because of insurance issues she couldn't be see or treated until April 1st. She was started on Levaquin. On May 18th ENT saw her and determined that the tube on her left ear had fallen in and the tube in her right ear was gone. On May 28th she had the left tube retrieved and then new tubes put in both ears. On June 20th her ear began itching and she had a clear odorless discharge from it. She was found to have corybacterium in that ear and subsequently started on a Z-pack. She has had high WBC counts since March and is concerned that something else is going on. Today her WBC are 12.6. She states that she has problems regulating her body temp. She has hot flashes with sweating. She also states she has endometriosis and had a laparoscopic procedure done several years ago to free her intestines from her uterus. She has psoriasis and uses Temovate cream. She denies fever, chills, n/v, cough, headache, dizziness, SOB, chest pain, palpitations, constipation, diarrhea, problems urinating, blood in urine or stool. She does have some  Mild abdominal pain occassionally. She states that she has no energy and feels tired all the time. She has a good appetite. She has no lymphadenopathy.   ROS: General ROS: negative Hematological and Lymphatic ROS: negative Respiratory ROS: no cough, shortness of breath, or wheezing negative Gastrointestinal ROS: no abdominal pain, change in bowel habits, or black or bloody stools negative Genito-Urinary ROS: no dysuria, trouble voiding, or hematuria negative Musculoskeletal ROS: negative Neurological ROS: no TIA or stroke  symptoms negative  PAST MEDICAL HISTORY:   Past Medical History  Diagnosis Date  . PCOS (polycystic ovarian syndrome)   . Hypothyroidism   . Depression   . Adult ADHD   . Allergy   . Endometriosis   . Psoriasis   . Status post ear surgery    ALLERGIES: Allergies  Allergen Reactions  . Effexor [Venlafaxine Hydrochloride] Other (See Comments)    seizures  . Sulfa Drugs Cross Reactors Swelling  . Tetracyclines & Related Shortness Of Breath  . Venlafaxine Anaphylaxis  . Ceclor [Cefaclor] Rash   MEDICATIONS:  Current Outpatient Prescriptions on File Prior to Visit  Medication Sig Dispense Refill  . amphetamine-dextroamphetamine (ADDERALL) 30 MG tablet Take 30 mg by mouth 2 (two) times daily.      . Cholecalciferol (VITAMIN D) 2000 UNITS tablet Take 2,000 Units by mouth.      . clindamycin (CLEOCIN T) 1 % lotion       . clobetasol ointment (TEMOVATE) 9.79 % Apply 1 application topically 2 (two) times daily.      Marland Kitchen levothyroxine (SYNTHROID) 50 MCG tablet Take 50 mcg by mouth daily.      . metFORMIN (GLUCOPHAGE) 500 MG tablet Take 500 mg by mouth 2 (two) times daily.      Marland Kitchen spironolactone (ALDACTONE) 100 MG tablet Take 100 mg by mouth daily.      Marland Kitchen topiramate (TOPAMAX) 50 MG tablet Take 50 mg by mouth 2 (two) times daily.      . valACYclovir (VALTREX) 1000 MG tablet  No current facility-administered medications on file prior to visit.   PAST SURGICAL HISTORY Past Surgical History  Procedure Laterality Date  . Mastoidectomy  2007  . Breast lumpectomy  2010    right  . Mastectomy, partial  2010    left  . Adenoidectomy    . Laprosc      FAMILY HISTORY: Family History  Problem Relation Age of Onset  . Cancer Father   . Heart disease Mother   . Heart disease Maternal Grandmother    SOCIAL HISTORY:  reports that she has been smoking Cigarettes.  She started smoking about 8 years ago. She has been smoking about 0.00 packs per day. She has never used smokeless  tobacco. She reports that she drinks about 1.2 ounces of alcohol per week. She reports that she does not use illicit drugs.  PERFORMANCE STATUS: The patient's performance status is 0 - Asymptomatic  PHYSICAL EXAM: Most Recent Vital Signs: Blood pressure 142/96, pulse 95, temperature 98.2 F (36.8 C), temperature source Oral, resp. rate 14, height 5\' 5"  (1.651 m), weight 216 lb (97.977 kg). BP 142/96  Pulse 95  Temp(Src) 98.2 F (36.8 C) (Oral)  Resp 14  Ht 5\' 5"  (1.651 m)  Wt 216 lb (97.977 kg)  BMI 35.94 kg/m2  General Appearance:    Alert, cooperative, no distress, appears stated age  Head:    Normocephalic, without obvious abnormality, atraumatic           Throat:   Lips, mucosa, and tongue normal; teeth and gums normal  Neck:   Supple, symmetrical, trachea midline, no adenopathy;    thyroid:  no enlargement/tenderness/nodules; no carotid   bruit or JVD  Back:     Symmetric, no curvature, ROM normal, no CVA tenderness  Lungs:     Clear to auscultation bilaterally, respirations unlabored  Chest Wall:    No tenderness or deformity   Heart:    Regular rate and rhythm, S1 and S2 normal, no murmur, rub   or gallop     Abdomen:     Soft, non-tender, bowel sounds active all four quadrants,    no masses, no organomegaly        Extremities:   Extremities normal, atraumatic, no cyanosis or edema  Pulses:   2+ and symmetric all extremities  Skin:   Skin color, texture, turgor normal, no rashes or lesions  Lymph nodes:   Cervical, supraclavicular, and axillary nodes normal  Neurologic:   CNII-XII intact, normal strength, sensation and reflexes    throughout    LABORATORY DATA:  Results for orders placed in visit on 05/30/14 (from the past 48 hour(s))  CBC WITH DIFFERENTIAL ([facility])     Status: Abnormal   Collection Time    05/30/14  9:08 AM      Result Value Ref Range   WBC 12.6 (*) 3.9 - 10.0 10e3/uL   RBC 5.16  3.70 - 5.32 10e6/uL   HGB 15.2  11.6 - 15.9 g/dL    HCT 43.8  34.8 - 46.6 %   MCV 85  81 - 101 fL   MCH 29.5  26.0 - 34.0 pg   MCHC 34.7  32.0 - 36.0 g/dL   RDW 14.0  11.1 - 15.7 %   Platelets 352  145 - 400 10e3/uL   NEUT# 6.4  1.5 - 6.5 10e3/uL   LYMPH# 4.6 (*) 0.9 - 3.3 10e3/uL   MONO# 1.0 (*) 0.1 - 0.9 10e3/uL   Eosinophils Absolute 0.5  0.0 - 0.5 10e3/uL   BASO# 0.0  0.0 - 0.2 10e3/uL   NEUT% 51.2  39.6 - 80.0 %   LYMPH% 36.8  14.0 - 48.0 %   MONO% 8.0  0.0 - 13.0 %   EOS% 3.7  0.0 - 7.0 %   BASO% 0.3  0.0 - 2.0 %  [facility] - SMEAR     Status: None   Collection Time    05/30/14  9:08 AM      Result Value Ref Range   Smear Result Smear Available    TSH [facility]     Status: None   Collection Time    05/30/14 11:11 AM      Result Value Ref Range   TSH 1.122  0.308 - 3.960 m(IU)/L  VITAMIN B12     Status: None   Collection Time    05/30/14 11:11 AM      Result Value Ref Range   Vitamin B-12 361  211 - 911 pg/mL  VITAMIN D 25 HYDROXY     Status: None   Collection Time    05/30/14 11:11 AM      Result Value Ref Range   Vit D, 25-Hydroxy 44  30 - 89 ng/mL   Comment: This assay accurately quantifies Vitamin D, which is the sum of the25-Hydroxy forms of Vitamin D2 and D3.  Studies have shown that theoptimum concentration of 25-Hydroxy Vitamin D is 30 ng/mL or higher. Concentrations of Vitamin D between 20 and 29 ng/mL      are considered tobe insufficient and concentrations less than 20 ng/mL are consideredto be deficient for Vitamin D.  IRON AND TIBC [facility]     Status: Abnormal   Collection Time    05/30/14 11:11 AM      Result Value Ref Range   Iron 167 (*) 41 - 142 ug/dL   TIBC 361  236 - 444 ug/dL   UIBC 194  120 - 384 ug/dL   %SAT 46  21 - 57 %  FERRITIN [facility]     Status: None   Collection Time    05/30/14 11:11 AM      Result Value Ref Range   Ferritin 85  9 - 269 ng/ml  COMPREHENSIVE METABOLIC PANEL     Status: None   Collection Time    05/30/14 11:11 AM      Result Value Ref Range   Sodium 137  135 - 145 mEq/L    Potassium 4.5  3.5 - 5.3 mEq/L   Chloride 105  96 - 112 mEq/L   CO2 19  19 - 32 mEq/L   Glucose, Bld 99  70 - 99 mg/dL   BUN 7  6 - 23 mg/dL   Creatinine, Ser 0.54  0.50 - 1.10 mg/dL   Total Bilirubin 0.5  0.2 - 1.2 mg/dL   Alkaline Phosphatase 67  39 - 117 U/L   AST 15  0 - 37 U/L   ALT 20  0 - 35 U/L   Total Protein 6.9  6.0 - 8.3 g/dL   Albumin 4.4  3.5 - 5.2 g/dL   Calcium 9.5  8.4 - 10.5 mg/dL  LACTATE DEHYDROGENASE     Status: None   Collection Time    05/30/14 11:11 AM      Result Value Ref Range   LDH 198  94 - 250 U/L      RADIOGRAPHY: No results found.     PATHOLOGY:  none  ASSESSMENT/PLAN:  Ms. Bochicchio is a pleasant 38 yo female here today with persistent leukocytosis since March. She is c/o feeling bad and staying tired all the time.  Her CBC showed a WBC count of 12.6. We will see what the rest of her labs show.  We will get a CT of her chest, abdomen and pelvis and see what those show. We will then schedule her next appointment. All questions were answered. The patient knows to call the clinic with any problems, questions or concerns. We can certainly see the patient much sooner if necessary. The patient and plan discussed with and she was also seen by Dr. Marin Olp and he is in agreement with the aforementioned.   [place] by Dr. Marin Olp as follows:  I saw and examined Ms. Merk. I spent about 45 minutes with her. She is very convinced that she has an underlying problem.  Her white cell count is barely above normal. I suspect that this is reactive. Her blood smear did not show any immature myeloid cells. There were no atypical lymphocytes. She had no blasts.  She does smoke. Is very possible that the leukocytosis might be reflective of smoking. She has a psoriasis. She uses a steroid cream. This steroid cream could be absorbed systemically and could cause a leukocytosis.  She probably should have a CT scan done. This I think is reasonable  because she smokes. She has a little bit of a cough. She is at risk for an underlying malignancy.  We sent off some additional lab work. So far, all were results have been normal. She does not a low vitamin B12. Her LDH is normal. Her vitamin D is okay. Her TSH is normal.  She complains of severe fatigue. I don't have any explanation for this. She is not iron deficient.     She seems to think that if we cannot help her out, then we will know who can help her out.  We will get her back to see [place] in another few weeks.  I tried to reassure Ms. Tumminello as much as I could that I cannot really find a problem with her blood. Again, I think that the mild leukocytosis is reactive and clearly not malignant nor symptomatic nor causing her fatigue.

## 2014-06-04 ENCOUNTER — Telehealth: Payer: Self-pay | Admitting: Hematology & Oncology

## 2014-06-04 NOTE — Telephone Encounter (Signed)
Pt called today to advice, she had [employer] commercial insurance from her ex-employer that is still valid.  I tried to do her PA for her CT's and this is what her plan says:  This member's plan does not currently require notification or prior-authorization through the [place] Notification or Prior-Authorization Program.  Please contact a Customer Care Professional at [phone] if you believe the information returned to be in error.   ID: [id number] GP: [id number]   Pt's p: [phone]  Patient ID: [id number]  Patient Name: Gina Golden, Gina Golden

## 2014-06-05 ENCOUNTER — Ambulatory Visit (HOSPITAL_BASED_OUTPATIENT_CLINIC_OR_DEPARTMENT_OTHER)
Admission: RE | Admit: 2014-06-05 | Discharge: 2014-06-05 | Disposition: A | Discharge status: Home / Self Care | Payer: BC Managed Care – PPO | Source: Ambulatory Visit | Attending: Hematology & Oncology | Admitting: Hematology & Oncology

## 2014-06-05 ENCOUNTER — Encounter (HOSPITAL_BASED_OUTPATIENT_CLINIC_OR_DEPARTMENT_OTHER): Payer: Self-pay

## 2014-06-05 DIAGNOSIS — R05 Cough: Secondary | ICD-10-CM

## 2014-06-05 DIAGNOSIS — R5381 Other malaise: Secondary | ICD-10-CM | POA: Insufficient documentation

## 2014-06-05 DIAGNOSIS — N83209 Unspecified ovarian cyst, unspecified side: Secondary | ICD-10-CM | POA: Insufficient documentation

## 2014-06-05 DIAGNOSIS — Z975 Presence of (intrauterine) contraceptive device: Secondary | ICD-10-CM | POA: Insufficient documentation

## 2014-06-05 DIAGNOSIS — M47814 Spondylosis without myelopathy or radiculopathy, thoracic region: Secondary | ICD-10-CM | POA: Insufficient documentation

## 2014-06-05 DIAGNOSIS — R053 Chronic cough: Secondary | ICD-10-CM

## 2014-06-05 DIAGNOSIS — K7460 Unspecified cirrhosis of liver: Secondary | ICD-10-CM | POA: Insufficient documentation

## 2014-06-05 DIAGNOSIS — N281 Cyst of kidney, acquired: Secondary | ICD-10-CM | POA: Insufficient documentation

## 2014-06-05 DIAGNOSIS — R059 Cough, unspecified: Secondary | ICD-10-CM | POA: Insufficient documentation

## 2014-06-05 DIAGNOSIS — R5383 Other fatigue: Secondary | ICD-10-CM

## 2014-06-05 DIAGNOSIS — D72829 Elevated white blood cell count, unspecified: Secondary | ICD-10-CM | POA: Insufficient documentation

## 2014-06-05 DIAGNOSIS — R161 Splenomegaly, not elsewhere classified: Secondary | ICD-10-CM

## 2014-06-05 MED ORDER — IOHEXOL 300 MG/ML  SOLN
100.0000 mL | Freq: Once | INTRAMUSCULAR | Status: AC | PRN
  Administered 2014-06-05: 100 mL via INTRAVENOUS

## 2014-06-06 ENCOUNTER — Ambulatory Visit: Payer: 59 | Admitting: Obstetrics & Gynecology

## 2014-06-07 NOTE — Telephone Encounter (Signed)
Patient has an appointment 06-13-14 and will discuss at this appointment.

## 2014-06-10 ENCOUNTER — Telehealth: Payer: Self-pay | Admitting: Hematology & Oncology

## 2014-06-10 ENCOUNTER — Telehealth: Payer: Self-pay | Admitting: *Deleted

## 2014-06-10 NOTE — Telephone Encounter (Addendum)
Message copied by Lenn Sink on Mon Jun 10, 2014 11:51 AM ------      Message from: Volanda Napoleon      Created: Sun Jun 09, 2014  8:35 PM       Call - CT scan is negative for any cancer or infection or inflammation.  pete ------Called pt. Pt sounded as though she was upset. Asked pt how she was doing today. Pt stated she "was not well." Asked pt to explain what exactly was going on. Pt stated "I have been very fatigued since June 23rd." Informed pt that I looked in her note from her office visit and Dr Marin Olp stated in that note that he didn't see her high wbc causing her fatigue. Informed pt that I would ask Dr Marin Olp if he had any other suggestions. Placed pt on "hold" as I asked Dr Marin Olp his suggestion. When I came back to the phone, pt was upset that I put her on "hold." Pt stated she needed someone to tell her "whats going on. Informed pt Dr Marin Olp didn't know why she is so fatigued. Informed pt that we were referring her back to PCP for future referrals since we don't have any treatment since we don't know the fatigue cause. Pt was upset. Informed pt I would let my manager know and if my manager could help out, then she would call the pt. Pt verbalized understanding.

## 2014-06-10 NOTE — Telephone Encounter (Signed)
Pt called wanting MR for [facility]. I was advised to tell pt to go to [facility] and sign a release there to get MR.  Per Trinna Post

## 2014-06-13 ENCOUNTER — Ambulatory Visit (INDEPENDENT_AMBULATORY_CARE_PROVIDER_SITE_OTHER): Payer: 59 | Admitting: Obstetrics & Gynecology

## 2014-06-13 ENCOUNTER — Encounter: Payer: Self-pay | Admitting: Obstetrics & Gynecology

## 2014-06-13 ENCOUNTER — Other Ambulatory Visit: Payer: Self-pay | Admitting: Obstetrics & Gynecology

## 2014-06-13 VITALS — BP 123/83 | HR 91 | Temp 97.9°F | Ht 65.0 in | Wt 219.0 lb

## 2014-06-13 DIAGNOSIS — R5383 Other fatigue: Secondary | ICD-10-CM

## 2014-06-13 DIAGNOSIS — M6281 Muscle weakness (generalized): Secondary | ICD-10-CM

## 2014-06-13 DIAGNOSIS — R5381 Other malaise: Secondary | ICD-10-CM

## 2014-06-13 DIAGNOSIS — Z975 Presence of (intrauterine) contraceptive device: Secondary | ICD-10-CM

## 2014-06-13 LAB — AST: AST: 11 U/L (ref 0–37)

## 2014-06-13 LAB — ALT: ALT: 16 U/L (ref 0–35)

## 2014-06-13 LAB — LACTATE DEHYDROGENASE: LDH: 199 U/L (ref 94–250)

## 2014-06-13 LAB — CK: Total CK: 33 U/L (ref 7–177)

## 2014-06-14 ENCOUNTER — Other Ambulatory Visit: Payer: 59

## 2014-06-14 LAB — EPSTEIN-BARR VIRUS VCA ANTIBODY PANEL
EBV EA IgG: 66.3 U/mL — ABNORMAL HIGH (ref <9.0)
EBV NA IgG: 9.6 U/mL (ref <18.0)
EBV VCA IGG: 102 U/mL — AB (ref <18.0)
EBV VCA IgM: <10 U/mL (ref <36.0)

## 2014-06-14 LAB — HIV ANTIBODY (ROUTINE TESTING W REFLEX): HIV 1&2 Ab, 4th Generation: NONREACTIVE

## 2014-06-14 LAB — RPR

## 2014-06-14 LAB — ANA: Anti Nuclear Antibody(ANA): NEGATIVE

## 2014-06-14 LAB — B. BURGDORFI ANTIBODIES: B BURGDORFERI AB IGG+ IGM: 0.1 {ISR}

## 2014-06-14 NOTE — Progress Notes (Signed)
Patient ID: CARA THAXTON, female   DOB: Jul 14, 1976, 38 y.o.   MRN: [id number]  Chief Complaint  Patient presents with  . Fatigue, muscle weakness    HPI KEISHLA OYER is a 38 y.o. female.  See above.  She has been evaluated by a hematologist for the above complaints.  HPI  Past Medical History  Diagnosis Date  . PCOS (polycystic ovarian syndrome)   . Hypothyroidism   . Depression   . Adult ADHD   . Allergy   . Endometriosis   . Psoriasis   . Status post ear surgery     Past Surgical History  Procedure Laterality Date  . Mastoidectomy  2007  . Breast lumpectomy  2010    right  . Mastectomy, partial  2010    left  . Adenoidectomy    . Laprosc      Family History  Problem Relation Age of Onset  . Cancer Father   . Heart disease Mother   . Heart disease Maternal Grandmother     Social History History  Substance Use Topics  . Smoking status: Current Every Day Smoker    Types: Cigarettes    Start date: 11/08/2005  . Smokeless tobacco: Never Used  . Alcohol Use: 1.2 oz/week    2 Shots of liquor per week     Comment: social     Allergies  Allergen Reactions  . Effexor [Venlafaxine Hydrochloride] Other (See Comments)    seizures  . Levaquin [Levofloxacin In D5w] Shortness Of Breath, Itching and Swelling  . Sulfa Drugs Cross Reactors Swelling  . Tetracyclines & Related Shortness Of Breath  . Venlafaxine Anaphylaxis  . Ceclor [Cefaclor] Rash    Current Outpatient Prescriptions  Medication Sig Dispense Refill  . levothyroxine (SYNTHROID) 50 MCG tablet Take 50 mcg by mouth daily.      . Lurasidone HCl 20 MG TABS Take by mouth every morning.      . Cholecalciferol (VITAMIN D) 2000 UNITS tablet Take 2,000 Units by mouth.      . clindamycin (CLEOCIN T) 1 % lotion       . clobetasol ointment (TEMOVATE) 4.97 % Apply 1 application topically 2 (two) times daily.      . metFORMIN (GLUCOPHAGE) 500 MG tablet Take 500 mg by mouth 2 (two) times daily.      Marland Kitchen  spironolactone (ALDACTONE) 100 MG tablet Take 100 mg by mouth daily.      Marland Kitchen topiramate (TOPAMAX) 50 MG tablet Take 50 mg by mouth 2 (two) times daily.      . valACYclovir (VALTREX) 1000 MG tablet        No current facility-administered medications for this visit.    Review of Systems Review of Systems Constitutional: positive for fatigue  Respiratory: negative for cough and wheezing Cardiovascular: negative for chest pain, fatigue and palpitations Gastrointestinal: negative for abdominal pain and change in bowel habits Genitourinary:negative for abnormal vaginal discharge Integument/breast: negative for nipple discharge Musculoskeletal:negative for myalgias; positive for weakness Neurological: negative for gait problems and tremors Behavioral/Psych: negative for abusive relationship, depression Endocrine: negative for temperature intolerance     Blood pressure 123/83, pulse 91, temperature 97.9 F (36.6 C), height 5\' 5"  (1.651 m), weight 99.338 kg (219 lb).  Physical Exam Physical Exam General:   alert  Skin:   no rash or abnormalities    50% of 15 min visit spent on counseling and coordination of care.   Data Reviewed None  Assessment    Multiple  somatic complaints--?rheumatologic d/o, chronic fatigue syndrome, fibromyalgia, myositis, hypothyroidism(has history)     Plan    Orders Placed This Encounter  Procedures  . Epstein-Barr virus VCA antibody panel  . CMV IgM  . Antinuclear Antib (ANA)  . CK (Creatine Kinase)  . Aldolase  . Lactate Dehydrogenase  . AST  . ALT  . B. Burgdorfi Antibodies  . Ambulatory referral to Rheumatology    Referral Priority:  Routine    Referral Type:  Consultation    Referral Reason:  Specialty Services Required    Requested Specialty:  Rheumatology    Number of Visits Requested:  1  . Ambulatory referral to Neurology    Referral Priority:  Routine    Referral Type:  Consultation    Referral Reason:  Specialty Services Required     Requested Specialty:  Neurology    Number of Visits Requested:  1     Follow up as needed.         JACKSON-MOORE,Kerington Hildebrant A 06/14/2014, 9:35 PM

## 2014-06-15 ENCOUNTER — Emergency Department (HOSPITAL_COMMUNITY): Payer: BC Managed Care – PPO

## 2014-06-15 ENCOUNTER — Emergency Department (HOSPITAL_COMMUNITY)
Admission: EM | Admit: 2014-06-15 | Discharge: 2014-06-16 | Disposition: A | Discharge status: Home / Self Care | Payer: BC Managed Care – PPO | Attending: Emergency Medicine | Admitting: Emergency Medicine

## 2014-06-15 DIAGNOSIS — R109 Unspecified abdominal pain: Secondary | ICD-10-CM | POA: Diagnosis present

## 2014-06-15 DIAGNOSIS — Z872 Personal history of diseases of the skin and subcutaneous tissue: Secondary | ICD-10-CM | POA: Insufficient documentation

## 2014-06-15 DIAGNOSIS — IMO0002 Reserved for concepts with insufficient information to code with codable children: Secondary | ICD-10-CM | POA: Insufficient documentation

## 2014-06-15 DIAGNOSIS — Z8659 Personal history of other mental and behavioral disorders: Secondary | ICD-10-CM | POA: Insufficient documentation

## 2014-06-15 DIAGNOSIS — R102 Pelvic and perineal pain: Secondary | ICD-10-CM

## 2014-06-15 DIAGNOSIS — F17200 Nicotine dependence, unspecified, uncomplicated: Secondary | ICD-10-CM | POA: Diagnosis not present

## 2014-06-15 DIAGNOSIS — Z8742 Personal history of other diseases of the female genital tract: Secondary | ICD-10-CM | POA: Diagnosis not present

## 2014-06-15 DIAGNOSIS — Z792 Long term (current) use of antibiotics: Secondary | ICD-10-CM | POA: Diagnosis not present

## 2014-06-15 DIAGNOSIS — Z3202 Encounter for pregnancy test, result negative: Secondary | ICD-10-CM | POA: Diagnosis not present

## 2014-06-15 DIAGNOSIS — E039 Hypothyroidism, unspecified: Secondary | ICD-10-CM | POA: Diagnosis not present

## 2014-06-15 DIAGNOSIS — Z79899 Other long term (current) drug therapy: Secondary | ICD-10-CM | POA: Insufficient documentation

## 2014-06-15 DIAGNOSIS — N949 Unspecified condition associated with female genital organs and menstrual cycle: Secondary | ICD-10-CM | POA: Diagnosis not present

## 2014-06-15 LAB — COMPREHENSIVE METABOLIC PANEL
ALT: 16 U/L (ref 0–35)
AST: 14 U/L (ref 0–37)
Albumin: 3.5 g/dL (ref 3.5–5.2)
Alkaline Phosphatase: 92 U/L (ref 39–117)
Anion gap: 13 (ref 5–15)
BUN: 8 mg/dL (ref 6–23)
CO2: 20 mEq/L (ref 19–32)
Calcium: 8.9 mg/dL (ref 8.4–10.5)
Chloride: 105 mEq/L (ref 96–112)
Creatinine, Ser: 0.52 mg/dL (ref 0.50–1.10)
GFR calc Af Amer: >90 mL/min (ref >90)
GFR calc non Af Amer: >90 mL/min (ref >90)
Glucose, Bld: 109 mg/dL — ABNORMAL HIGH (ref 70–99)
Potassium: 3.8 mEq/L (ref 3.7–5.3)
Sodium: 138 mEq/L (ref 137–147)
Total Bilirubin: <0.2 mg/dL — ABNORMAL LOW (ref 0.3–1.2)
Total Protein: 6.7 g/dL (ref 6.0–8.3)

## 2014-06-15 LAB — CBC WITH DIFFERENTIAL/PLATELET
Basophils Absolute: 0 10*3/uL (ref 0.0–0.1)
Basophils Relative: 0 % (ref 0–1)
Eosinophils Absolute: 0.5 10*3/uL (ref 0.0–0.7)
Eosinophils Relative: 4 % (ref 0–5)
HCT: 40.3 % (ref 36.0–46.0)
Hemoglobin: 13.9 g/dL (ref 12.0–15.0)
Lymphocytes Relative: 38 % (ref 12–46)
Lymphs Abs: 5.4 10*3/uL — ABNORMAL HIGH (ref 0.7–4.0)
MCH: 29.4 pg (ref 26.0–34.0)
MCHC: 34.5 g/dL (ref 30.0–36.0)
MCV: 85.2 fL (ref 78.0–100.0)
Monocytes Absolute: 1 10*3/uL (ref 0.1–1.0)
Monocytes Relative: 7 % (ref 3–12)
Neutro Abs: 7.5 10*3/uL (ref 1.7–7.7)
Neutrophils Relative %: 51 % (ref 43–77)
Platelets: 278 10*3/uL (ref 150–400)
RBC: 4.73 MIL/uL (ref 3.87–5.11)
RDW: 13.6 % (ref 11.5–15.5)
WBC: 14.3 10*3/uL — ABNORMAL HIGH (ref 4.0–10.5)

## 2014-06-15 LAB — URINALYSIS, ROUTINE W REFLEX MICROSCOPIC
Bilirubin Urine: NEGATIVE
Glucose, UA: NEGATIVE mg/dL
Hgb urine dipstick: NEGATIVE
Ketones, ur: NEGATIVE mg/dL
Leukocytes, UA: NEGATIVE
Nitrite: NEGATIVE
Protein, ur: NEGATIVE mg/dL
Specific Gravity, Urine: 1.005 (ref 1.005–1.030)
Urobilinogen, UA: 0.2 mg/dL (ref 0.0–1.0)
pH: 7 (ref 5.0–8.0)

## 2014-06-15 LAB — GC/CHLAMYDIA PROBE AMP
CT PROBE, AMP APTIMA: NEGATIVE
GC Probe RNA: NEGATIVE

## 2014-06-15 LAB — ALDOLASE: ALDOLASE: 7 U/L (ref <=8.1)

## 2014-06-15 LAB — CMV IGM: CMV IgM: <8 AU/mL (ref <30.00)

## 2014-06-15 LAB — LIPASE, BLOOD: Lipase: 28 U/L (ref 11–59)

## 2014-06-15 LAB — PREGNANCY, URINE: Preg Test, Ur: NEGATIVE

## 2014-06-15 MED ORDER — HYDROMORPHONE HCL PF 1 MG/ML IJ SOLN
1.0000 mg | Freq: Once | INTRAMUSCULAR | Status: AC
  Administered 2014-06-15: 1 mg via INTRAVENOUS
  Filled 2014-06-15: qty 1

## 2014-06-15 MED ORDER — DIPHENHYDRAMINE HCL 50 MG/ML IJ SOLN
25.0000 mg | Freq: Once | INTRAMUSCULAR | Status: AC
  Administered 2014-06-15: 25 mg via INTRAVENOUS
  Filled 2014-06-15: qty 1

## 2014-06-15 NOTE — ED Notes (Signed)
Pt has had multiple scans in last 30 days,  She has a copy with her at bedside

## 2014-06-15 NOTE — ED Provider Notes (Signed)
CSN: [id number]     Arrival date & time 06/15/14  2015 History   First MD Initiated Contact with Patient 06/15/14 2035     Chief Complaint  Patient presents with  . Abdominal Pain     (Consider location/radiation/quality/duration/timing/severity/associated sxs/prior Treatment) HPI Patient presents to the emergency department with abdominal pain, that started earlier this evening.  Patient, states the pain came on suddenly around 6:00.  The patient, states, that the pain would come in waves and it seemed to radiate up the right side of her abdomen.  The patient, states, that she did not have any chest pain, shortness of breath, nausea, vomiting, headache, blurred vision, weakness, dizziness, back pain, neck pain, rash, fever, altered mental status, lymphadenopathy, or syncope.  Patient, states, that nothing seems make her condition, better and palpation makes the pain, worse.  Patient, states, that this feels similar to labor type pains.  Patient, states she did not take any medications prior to arrival Past Medical History  Diagnosis Date  . PCOS (polycystic ovarian syndrome)   . Hypothyroidism   . Depression   . Adult ADHD   . Allergy   . Endometriosis   . Psoriasis   . Status post ear surgery    Past Surgical History  Procedure Laterality Date  . Mastoidectomy  2007  . Breast lumpectomy  2010    right  . Mastectomy, partial  2010    left  . Adenoidectomy    . Laprosc     Family History  Problem Relation Age of Onset  . Cancer Father   . Heart disease Mother   . Heart disease Maternal Grandmother    History  Substance Use Topics  . Smoking status: Current Every Day Smoker    Types: Cigarettes    Start date: 11/08/2005  . Smokeless tobacco: Never Used  . Alcohol Use: 1.2 oz/week    2 Shots of liquor per week     Comment: social    OB History   Grav Para Term Preterm Abortions TAB SAB Ect Mult Living   1    1  1         Review of Systems  All other systems  negative except as documented in the HPI. All pertinent positives and negatives as reviewed in the HPI.  Allergies  Effexor; Levaquin; Sulfa drugs cross reactors; Tetracyclines & related; Venlafaxine; and Ceclor  Home Medications   Prior to Admission medications   Medication Sig Start Date End Date Taking? Authorizing Provider  acetaminophen (TYLENOL) 500 MG tablet Take 1,000 mg by mouth every 6 (six) hours as needed for moderate pain.   Yes Historical Provider, MD  clobetasol ointment (TEMOVATE) 6.23 % Apply 1 application topically 2 (two) times daily as needed (rash).    Yes Historical Provider, MD  levothyroxine (SYNTHROID) 50 MCG tablet Take 50 mcg by mouth daily.   Yes Historical Provider, MD  loratadine (CLARITIN) 10 MG tablet Take 10 mg by mouth daily.   Yes Historical Provider, MD  Lurasidone HCl 20 MG TABS Take 20 mg by mouth every morning.    Yes Historical Provider, MD  mometasone (NASONEX) 50 MCG/ACT nasal spray Place 2 sprays into the nose daily as needed (allergies).   Yes Historical Provider, MD  ofloxacin (FLOXIN) 0.3 % otic solution Place 5 drops into the left ear 2 (two) times daily.   Yes Historical Provider, MD  SUMAtriptan (IMITREX) 100 MG tablet Take 100 mg by mouth every 2 (two) hours as needed  for migraine or headache. May repeat in 2 hours if headache persists or recurs.   Yes Historical Provider, MD  valACYclovir (VALTREX) 1000 MG tablet Take 1,000 mg by mouth daily as needed (outbreaks).  03/11/14  Yes Historical Provider, MD   BP 140/82  Pulse 111  Temp(Src) 98.4 F (36.9 C) (Oral)  Resp 18  Ht 5\' 5"  (1.651 m)  Wt 218 lb (98.884 kg)  BMI 36.28 kg/m2  SpO2 100% Physical Exam  Nursing note and vitals reviewed. Constitutional: She is oriented to person, place, and time. She appears well-developed and well-nourished.  HENT:  Head: Normocephalic and atraumatic.  Mouth/Throat: Oropharynx is clear and moist.  Eyes: Pupils are equal, round, and reactive to light.    Neck: Normal range of motion. Neck supple.  Cardiovascular: Normal rate, regular rhythm and normal heart sounds.  Exam reveals no gallop and no friction rub.   No murmur heard. Pulmonary/Chest: Effort normal and breath sounds normal. No respiratory distress.  Abdominal: Soft. Normal appearance and bowel sounds are normal. She exhibits no distension. There is tenderness. There is no rebound and no guarding.    Neurological: She is alert and oriented to person, place, and time. She exhibits normal muscle tone. Coordination normal.  Skin: Skin is warm and dry. No rash noted. No erythema.    ED Course  Procedures (including critical care time) Labs Review Labs Reviewed  CBC WITH DIFFERENTIAL - Abnormal; Notable for the following:    WBC 14.3 (*)    Lymphs Abs 5.4 (*)    All other components within normal limits  COMPREHENSIVE METABOLIC PANEL - Abnormal; Notable for the following:    Glucose, Bld 109 (*)    Total Bilirubin <0.2 (*)    All other components within normal limits  URINE CULTURE  LIPASE, BLOOD  URINALYSIS, ROUTINE W REFLEX MICROSCOPIC  PREGNANCY, URINE    Imaging Review Ct Abdomen Pelvis Wo Contrast  06/15/2014   CLINICAL DATA:  Abdominal pain.  History of endometriosis.  EXAM: CT ABDOMEN AND PELVIS WITHOUT CONTRAST  TECHNIQUE: Multidetector CT imaging of the abdomen and pelvis was performed following the standard protocol without IV contrast.  COMPARISON:  Ultrasound of the pelvis 02/10/2012.  FINDINGS: No intrarenal or proximal ureteral calculi on either side. No evidence of hydronephrosis or other secondary signs of upper urinary tract obstruction. Within limits of unenhanced technique, normal appearing kidneys.  Again, within limits of unenhanced technique, remaining visualized upper abdomen unremarkable. Visualized extreme lung bases clear.  No distal ureteral calculi on either side. Appendix identified and normal. No osseous lesions.  Intrauterine device appropriately  located. 2 x 3 cm RIGHT parauterine rounded density inseparable from the uterus and having similar attenuation could represent a fibroid or a localized endometrioma. No free fluid in the pelvis. No spine abnormalities.  IMPRESSION: No acute abnormalities. No bowel or urinary tract obstruction. Normal appendix. Normally positioned IUD.  Possible RIGHT adnexal fibroid or endometrioma without pelvic free fluid or inflammation.   Electronically Signed   By: Rolla Flatten M.D.   On: 06/15/2014 22:03     Initially the concern for ureteral stone versus ovarian torsion is the concern patient has been stable here in the emergency department.  She states that she does have a history of endometriosis and polycystic ovarian syndrome.  Brent General, PA-C 06/16/14 [phone]

## 2014-06-15 NOTE — ED Notes (Signed)
Pt is itching and has red streaks from scratching,  Follow new verbal order per Irena Cords, PA

## 2014-06-15 NOTE — ED Notes (Signed)
Bed: DP82 Expected date: 06/15/14 Expected time: 8:13 PM Means of arrival: Ambulance Comments: Bed 14, EMS, 40 F, ABD Pain

## 2014-06-15 NOTE — ED Notes (Signed)
Abdominal pain started at 551 pm,  Comes in waves and is sharp and "labor like pains"  Pt has an IUD

## 2014-06-16 ENCOUNTER — Emergency Department (HOSPITAL_COMMUNITY): Payer: BC Managed Care – PPO

## 2014-06-16 DIAGNOSIS — N949 Unspecified condition associated with female genital organs and menstrual cycle: Secondary | ICD-10-CM | POA: Diagnosis not present

## 2014-06-16 MED ORDER — KETOROLAC TROMETHAMINE 30 MG/ML IJ SOLN
30.0000 mg | Freq: Once | INTRAMUSCULAR | Status: AC
  Administered 2014-06-16: 30 mg via INTRAVENOUS
  Filled 2014-06-16: qty 1

## 2014-06-16 MED ORDER — DIPHENHYDRAMINE HCL 50 MG/ML IJ SOLN
12.5000 mg | Freq: Once | INTRAMUSCULAR | Status: AC
  Administered 2014-06-16: 12.5 mg via INTRAVENOUS
  Filled 2014-06-16: qty 1

## 2014-06-16 MED ORDER — HYDROCODONE-ACETAMINOPHEN 5-325 MG PO TABS: 1.0000 | ORAL_TABLET | Freq: Four times a day (QID) | ORAL | Status: DC | PRN

## 2014-06-16 MED ORDER — IBUPROFEN 800 MG PO TABS: 800.0000 mg | ORAL_TABLET | Freq: Three times a day (TID) | ORAL | Status: DC | PRN

## 2014-06-16 NOTE — Discharge Instructions (Signed)
Return here as needed.  Followup with your primary care Dr. increase your fluid intake °

## 2014-06-16 NOTE — ED Provider Notes (Signed)
Medical screening examination/treatment/procedure(s) were performed by non-physician practitioner and as supervising physician I was immediately available for consultation/collaboration.   EKG Interpretation None        Ephraim Hamburger, MD 06/16/14 1731

## 2014-06-17 ENCOUNTER — Telehealth: Payer: Self-pay

## 2014-06-17 LAB — URINE CULTURE
Colony Count: NO GROWTH
Culture: NO GROWTH

## 2014-06-17 NOTE — Telephone Encounter (Signed)
spoke with patient about appt with [facility] - on 06/20/14 - will let her know about rhumotology appt when i hear back from [facility]

## 2014-06-19 ENCOUNTER — Encounter (HOSPITAL_COMMUNITY): Payer: Self-pay | Admitting: Emergency Medicine

## 2014-06-19 ENCOUNTER — Emergency Department (HOSPITAL_COMMUNITY)
Admission: EM | Admit: 2014-06-19 | Discharge: 2014-06-19 | Disposition: A | Discharge status: Home / Self Care | Payer: BC Managed Care – PPO | Attending: Emergency Medicine | Admitting: Emergency Medicine

## 2014-06-19 ENCOUNTER — Telehealth: Payer: Self-pay | Admitting: Neurology

## 2014-06-19 ENCOUNTER — Emergency Department (HOSPITAL_COMMUNITY): Payer: BC Managed Care – PPO

## 2014-06-19 ENCOUNTER — Telehealth: Payer: Self-pay

## 2014-06-19 DIAGNOSIS — G43819 Other migraine, intractable, without status migrainosus: Secondary | ICD-10-CM | POA: Insufficient documentation

## 2014-06-19 DIAGNOSIS — Z872 Personal history of diseases of the skin and subcutaneous tissue: Secondary | ICD-10-CM | POA: Insufficient documentation

## 2014-06-19 DIAGNOSIS — R4701 Aphasia: Secondary | ICD-10-CM | POA: Insufficient documentation

## 2014-06-19 DIAGNOSIS — Z792 Long term (current) use of antibiotics: Secondary | ICD-10-CM | POA: Insufficient documentation

## 2014-06-19 DIAGNOSIS — Z8659 Personal history of other mental and behavioral disorders: Secondary | ICD-10-CM | POA: Diagnosis not present

## 2014-06-19 DIAGNOSIS — R4789 Other speech disturbances: Secondary | ICD-10-CM

## 2014-06-19 DIAGNOSIS — R5383 Other fatigue: Secondary | ICD-10-CM

## 2014-06-19 DIAGNOSIS — R5381 Other malaise: Secondary | ICD-10-CM

## 2014-06-19 DIAGNOSIS — F17200 Nicotine dependence, unspecified, uncomplicated: Secondary | ICD-10-CM | POA: Insufficient documentation

## 2014-06-19 DIAGNOSIS — R51 Headache: Secondary | ICD-10-CM

## 2014-06-19 DIAGNOSIS — G43119 Migraine with aura, intractable, without status migrainosus: Secondary | ICD-10-CM | POA: Diagnosis not present

## 2014-06-19 DIAGNOSIS — Z8742 Personal history of other diseases of the female genital tract: Secondary | ICD-10-CM | POA: Insufficient documentation

## 2014-06-19 DIAGNOSIS — E039 Hypothyroidism, unspecified: Secondary | ICD-10-CM | POA: Diagnosis not present

## 2014-06-19 DIAGNOSIS — G43809 Other migraine, not intractable, without status migrainosus: Secondary | ICD-10-CM

## 2014-06-19 DIAGNOSIS — Z79899 Other long term (current) drug therapy: Secondary | ICD-10-CM | POA: Insufficient documentation

## 2014-06-19 NOTE — Discharge Instructions (Signed)

## 2014-06-19 NOTE — Telephone Encounter (Signed)
Called patient back had to leave her a voice mail. Stated to patient she has appt. 06-20-2014 with Dr.Ahern and she could address all her concerns at appt.

## 2014-06-19 NOTE — ED Provider Notes (Signed)
CSN: [id number]     Arrival date & time 06/19/14  1536 History   First MD Initiated Contact with Patient 06/19/14 1554     Chief Complaint  Patient presents with  . Aphasia    slurred speech     HPI Per EMS pt from home called out for slurred speech. Pt had fatigue starting June 23 and since has had intermittent- fatigue, slurred speech, muscle spasms, migraines, balance issues. Pt has appt. With neurologist tomorrow. Pt had steady gait with EMS. Pt sts slurred speech started at 1330 today. sts when she rests and starts speaking again slurred speech resolves. Pt sts these episodes have been going on since June 23 but her slurred speech has increased  Past Medical History  Diagnosis Date  . PCOS (polycystic ovarian syndrome)   . Hypothyroidism   . Depression   . Adult ADHD   . Allergy   . Endometriosis   . Psoriasis   . Status post ear surgery    Past Surgical History  Procedure Laterality Date  . Mastoidectomy  2007  . Breast lumpectomy  2010    right  . Mastectomy, partial  2010    left  . Adenoidectomy    . Laprosc     Family History  Problem Relation Age of Onset  . Cancer Father   . Heart disease Mother   . Heart disease Maternal Grandmother    History  Substance Use Topics  . Smoking status: Current Every Day Smoker    Types: Cigarettes    Start date: 11/08/2005  . Smokeless tobacco: Never Used  . Alcohol Use: 1.2 oz/week    2 Shots of liquor per week     Comment: social    OB History   Grav Para Term Preterm Abortions TAB SAB Ect Mult Living   1    1  1         Review of Systems  All other systems reviewed and are negative  Allergies  Effexor; Levaquin; Sulfa drugs cross reactors; Tetracyclines & related; Dilaudid; Ibuprofen; Naproxen; and Ceclor  Home Medications   Prior to Admission medications   Medication Sig Start Date End Date Taking? Authorizing Provider  acetaminophen (TYLENOL) 500 MG tablet Take 1,000 mg by mouth every 6 (six) hours as  needed for moderate pain.   Yes Historical Provider, MD  clobetasol ointment (TEMOVATE) 5.09 % Apply 1 application topically 2 (two) times daily as needed (rash).    Yes Historical Provider, MD  HYDROcodone-acetaminophen (NORCO/VICODIN) 5-325 MG per tablet Take 1 tablet by mouth every 6 (six) hours as needed for moderate pain. 06/16/14  Yes Resa Miner Lawyer, PA-C  levothyroxine (SYNTHROID) 50 MCG tablet Take 50 mcg by mouth every evening.    Yes Historical Provider, MD  loratadine (CLARITIN) 10 MG tablet Take 10 mg by mouth daily.   Yes Historical Provider, MD  mometasone (NASONEX) 50 MCG/ACT nasal spray Place 2 sprays into the nose daily.    Yes Historical Provider, MD  ofloxacin (FLOXIN) 0.3 % otic solution Place 5 drops into the left ear 2 (two) times daily.   Yes Historical Provider, MD  SUMAtriptan (IMITREX) 100 MG tablet Take 100 mg by mouth every 2 (two) hours as needed for migraine or headache. May repeat in 2 hours if headache persists or recurs.   Yes Historical Provider, MD  valACYclovir (VALTREX) 1000 MG tablet Take 1,000 mg by mouth daily as needed (outbreaks).  03/11/14  Yes Historical Provider, MD   BP  125/93  Pulse 87  Temp(Src) 98.2 F (36.8 C) (Oral)  Resp 21  SpO2 97% Physical Exam Physical Exam  Nursing note and vitals reviewed. Constitutional: She is oriented to person, place, and time. She appears well-developed and well-nourished. No distress.  HENT:  Head: Normocephalic and atraumatic.  Eyes: Pupils are equal, round, and reactive to light.  Neck: Normal range of motion.  Cardiovascular: Normal rate and intact distal pulses.   Pulmonary/Chest: No respiratory distress.  Abdominal: Normal appearance. She exhibits no distension.  Musculoskeletal: Normal range of motion.  Neurological: She is alert and oriented to person, place, and time. No cranial nerve deficit.  Skin: Skin is warm and dry. No rash noted.  no focal or lateralizing weakness or numbness.  No cranial  nerve deficit.  No cerebellar ataxia. Psychiatric: She has a normal mood and affect. Her behavior is normal.   ED Course  Procedures (including critical care time) Labs Review Labs Reviewed - No data to display  Imaging Review Mr Brain Wo Contrast  06/19/2014   CLINICAL DATA:  Aphasia.  EXAM: MRI HEAD WITHOUT CONTRAST  TECHNIQUE: Multiplanar, multiecho pulse sequences of the brain and surrounding structures were obtained without intravenous contrast.  COMPARISON:  Brain MRI 05/01/2005  FINDINGS: There is mild cerebellar tonsillar ectopia, measuring 5 mm on the right, not significantly changed. Cerebellar tonsils maintain a normal rounded shape.  There is no acute infarct. Ventricles and sulci are normal for age. There is no evidence of intracranial hemorrhage, mass, midline shift, or extra-axial fluid collection. No brain parenchymal signal abnormality is identified.  Orbits are unremarkable. Minimal bilateral maxillary sinus mucosal thickening and trace bilateral mastoid effusions are noted. Major intracranial vascular flow voids are preserved. Calvarium and scalp soft tissues are unremarkable.  IMPRESSION: 1. No acute intracranial abnormality. 2. Unchanged cerebellar tonsillar ectopia, otherwise unremarkable appearance of the brain.   Electronically Signed   By: Logan Bores   On: 06/19/2014 17:45     EKG Interpretation   Date/Time:  Wednesday June 19 2014 15:50:38 EDT Ventricular Rate:  95 PR Interval:  108 QRS Duration: 96 QT Interval:  350 QTC Calculation: 440 R Axis:   53 Text Interpretation:  Sinus rhythm Short PR interval Borderline T wave  abnormalities No significant change since last tracing Confirmed by Thuy Atilano   MD, Taesha Goodell ([id number]) on 06/19/2014 3:53:40 PM     Patient was seen and evaluated by neurology who felt that she was stable for discharge and further outpatient workup. MDM   Final diagnoses:  Migraine variant        Dot Lanes, MD 06/19/14 1920

## 2014-06-19 NOTE — ED Notes (Addendum)
Neurology consult at bedside.

## 2014-06-19 NOTE — Telephone Encounter (Signed)
Patient calling to let me listen to how she was talking, patient had very slurred speech and was talking very slowly. Patient states she wanted someone to hear what was going on before she came in for her appointment tomorrow because she said her speech will be back to normal by then. If questions, please call patient.

## 2014-06-19 NOTE — Consult Note (Signed)
NEURO HOSPITALIST CONSULT NOTE    Reason for Consult:HA, FATIGUE, IMBALANCE, LANGUAGE/SPEECH IMPAIRMENT.  HPI:                                                                                                                                          Gina Golden is an 38 y.o. female with a past medical history significant for hypothyroidism, depression, PCOS, adult ADHD, psoriasis, comes in for further evaluation of the above symptoms. She said that since early July she has been experiencing extreme fatigue, lack of energy, jumping/twitching in the right arm-left face-left arm mainly when she is tired, imbalance, blurred vision, HA, " swiss cheese brain" that she described as having rouble with her vocabulary, and progressive worsening slurred speech over the last 1 1/2 week. Stated that the slurred speech got worse today and she was advised to come to the ED. Patient indicated that she had had 4 severe HA in the past few weeks. The above described symptoms don't always occur in association with a HA and as a matter of fact she hasn't had a HA in the last 4 days. Denies recent fever, head/neck trauma, focal weakness or numbness. She tells me that approximately 10 years ago she was tested for MS and everything was negative. MRI brain today was unremarkable. She has an appointment to see a neurologist at [facility] tomorrow.    Past Medical History  Diagnosis Date  . PCOS (polycystic ovarian syndrome)   . Hypothyroidism   . Depression   . Adult ADHD   . Allergy   . Endometriosis   . Psoriasis   . Status post ear surgery     Past Surgical History  Procedure Laterality Date  . Mastoidectomy  2007  . Breast lumpectomy  2010    right  . Mastectomy, partial  2010    left  . Adenoidectomy    . Laprosc      Family History  Problem Relation Age of Onset  . Cancer Father   . Heart disease Mother   . Heart disease Maternal Grandmother     Social History:  reports that  she has been smoking Cigarettes.  She started smoking about 8 years ago. She has been smoking about 0.00 packs per day. She has never used smokeless tobacco. She reports that she drinks about 1.2 ounces of alcohol per week. She reports that she does not use illicit drugs.  Allergies  Allergen Reactions  . Effexor [Venlafaxine Hydrochloride] Other (See Comments)    seizures  . Levaquin [Levofloxacin In D5w] Shortness Of Breath, Itching and Swelling  . Sulfa Drugs Cross Reactors Itching and Swelling    Full body-head to toe swelling  . Tetracyclines & Related Shortness Of Breath  . Dilaudid [Hydromorphone] Itching  Very intense itching  . Ibuprofen Other (See Comments)    increases acid reflux  . Naproxen Other (See Comments)    Increases acid reflux  . Ceclor [Cefaclor] Rash    MEDICATIONS:                                                                                                                     I have reviewed the patient's current medications.   ROS:                                                                                                                                       History obtained from the patient  General ROS: negative for - chills,  fever, night sweats, or weight loss Psychological ROS: negative for - behavioral disorder, hallucinations, mood swings or suicidal ideation Ophthalmic ROS: negative for - double vision, eye pain or loss of vision ENT ROS: negative for - epistaxis, nasal discharge, oral lesions, sore throat, tinnitus or vertigo Allergy and Immunology ROS: negative for - hives or itchy/watery eyes Hematological and Lymphatic ROS: negative for - bleeding problems, bruising or swollen lymph nodes Endocrine ROS: negative for - galactorrhea, hair pattern changes, polydipsia/polyuria or temperature intolerance Respiratory ROS: negative for - cough, hemoptysis, shortness of breath or wheezing Cardiovascular ROS: negative for - chest pain, dyspnea  on exertion, edema or irregular heartbeat Gastrointestinal ROS: negative for - abdominal pain, diarrhea, hematemesis, nausea/vomiting or stool incontinence Genito-Urinary ROS: negative for - dysuria, hematuria, incontinence or urinary frequency/urgency Musculoskeletal ROS: negative for - joint swelling or muscular weakness Neurological ROS: as noted in HPI Dermatological ROS: negative for rash and skin lesion changes  Physical exam: pleasant female in no apparent distress. Blood pressure 115/70, pulse 86, temperature 98.2 F (36.8 C), temperature source Oral, resp. rate 23, SpO2 98.00%. Head: normocephalic. Neck: supple, no bruits, no JVD. Cardiac: no murmurs. Lungs: clear. Abdomen: soft, no tender, no mass. Extremities: no edema. Neurologic Examination:  General: Mental Status: Alert, oriented, thought content appropriate.  Speech fluent without evidence of aphasia.  Able to follow 3 step commands without difficulty. Cranial Nerves: II: Discs flat bilaterally; Visual fields grossly normal, pupils equal, round, reactive to light and accommodation III,IV, VI: ptosis not present, extra-ocular motions intact bilaterally V,VII: smile symmetric, facial light touch sensation normal bilaterally VIII: hearing normal bilaterally IX,X: gag reflex present XI: bilateral shoulder shrug XII: midline tongue extension without atrophy or fasciculations  Motor: Right : Upper extremity   5/5    Left:     Upper extremity   5/5  Lower extremity   5/5     Lower extremity   5/5 Tone and bulk:normal tone throughout; no atrophy noted Sensory: Pinprick and light touch intact throughout, bilaterally Deep Tendon Reflexes:  Right: Upper Extremity   Left: Upper extremity   biceps (C-5 to C-6) 2/4   biceps (C-5 to C-6) 2/4 tricep (C7) 2/4    triceps (C7) 2/4 Brachioradialis (C6) 2/4  Brachioradialis (C6)  2/4  Lower Extremity Lower Extremity  quadriceps (L-2 to L-4) 2/4   quadriceps (L-2 to L-4) 2/4 Achilles (S1) 2/4   Achilles (S1) 2/4  Plantars: Right: downgoing   Left: downgoing Cerebellar: normal finger-to-nose,  normal heel-to-shin test Gait:  No tested.    No results found for this basename: cbc, bmp, coags, chol, tri, ldl, hga1c    No results found for this or any previous visit (from the past 31 hour(s)).  Mr Brain Wo Contrast  06/19/2014   CLINICAL DATA:  Aphasia.  EXAM: MRI HEAD WITHOUT CONTRAST  TECHNIQUE: Multiplanar, multiecho pulse sequences of the brain and surrounding structures were obtained without intravenous contrast.  COMPARISON:  Brain MRI 05/01/2005  FINDINGS: There is mild cerebellar tonsillar ectopia, measuring 5 mm on the right, not significantly changed. Cerebellar tonsils maintain a normal rounded shape.  There is no acute infarct. Ventricles and sulci are normal for age. There is no evidence of intracranial hemorrhage, mass, midline shift, or extra-axial fluid collection. No brain parenchymal signal abnormality is identified.  Orbits are unremarkable. Minimal bilateral maxillary sinus mucosal thickening and trace bilateral mastoid effusions are noted. Major intracranial vascular flow voids are preserved. Calvarium and scalp soft tissues are unremarkable.  IMPRESSION: 1. No acute intracranial abnormality. 2. Unchanged cerebellar tonsillar ectopia, otherwise unremarkable appearance of the brain.   Electronically Signed   By: Logan Bores   On: 06/19/2014 17:45   Assessment/Plan: 38 y/o with a myriad of symptoms that can not be localized to a specific segment of the neuro-axis, normal neuro-exam, and unremarkable MRI brain. I certainly don't have a solid explanation for her symptoms at this moment, but advised her to keep her neurology appointment tomorrow in order to pursue further neurological testing. She can be discharge home today.  Dorian Pod,  MD 06/19/2014, 6:35 PM [facility].

## 2014-06-19 NOTE — ED Notes (Signed)
PT placed in gown and in bed. PT monitored by pulse ox, bp cuff, and 5-lead. 

## 2014-06-19 NOTE — Telephone Encounter (Signed)
Per [facility] - Dr reviewed notes and wanted patient to see neurologist first before seeing her there - patient was made aware of this - spoke with her on Tuesday, 06/18/14

## 2014-06-19 NOTE — ED Notes (Signed)
Per EMS pt from home called out for slurred speech. Pt had fatigue starting June 23 and since has had intermittent- fatigue, slurred speech, muscle spasms, migraines, balance issues. Pt has appt. With neurologist tomorrow. Pt had steady gait with EMS. Pt sts slurred speech started at 1330 today. sts when she rests and starts speaking again slurred speech resolves. Pt sts these episodes have been going on since June 23 but her slurred speech has increased.

## 2014-06-20 ENCOUNTER — Encounter: Payer: Self-pay | Admitting: Neurology

## 2014-06-20 ENCOUNTER — Ambulatory Visit (INDEPENDENT_AMBULATORY_CARE_PROVIDER_SITE_OTHER): Payer: BC Managed Care – PPO | Admitting: Neurology

## 2014-06-20 VITALS — BP 126/86 | HR 94 | Ht 65.0 in | Wt 217.0 lb

## 2014-06-20 DIAGNOSIS — R5381 Other malaise: Secondary | ICD-10-CM

## 2014-06-20 DIAGNOSIS — R51 Headache: Secondary | ICD-10-CM | POA: Insufficient documentation

## 2014-06-20 DIAGNOSIS — R5383 Other fatigue: Secondary | ICD-10-CM

## 2014-06-20 DIAGNOSIS — R471 Dysarthria and anarthria: Secondary | ICD-10-CM | POA: Insufficient documentation

## 2014-06-20 DIAGNOSIS — R531 Weakness: Secondary | ICD-10-CM

## 2014-06-20 DIAGNOSIS — R519 Headache, unspecified: Secondary | ICD-10-CM | POA: Insufficient documentation

## 2014-06-20 NOTE — Patient Instructions (Signed)
Remember to drink plenty of fluid, eat healthy meals and do not skip any meals. Try to eat protein with a every meal and eat a healthy snack such as fruit or nuts in between meals. Try to keep a regular sleep-wake schedule and try to exercise daily, particularly in the form of walking, 20-30 minutes a day, if you can.   As far as diagnostic testing: please proceed to lab today for bloodwork and schedule an EMG/NCS at the front desk with me for next week.  I would like to see you back in 1 week for EMG/NCS, sooner if we need to. Please call us with any interim questions, concerns, problems, updates or refill requests.   We will call you with test results so we can go over those with you on the phone.  My clinical assistant and will answer any of your questions and relay your messages to me and also relay most of my messages to you.   Our phone number is [phone]. We also have an after hours call service for urgent matters and there is a physician on-call for urgent questions. For any emergencies you know to call 911 or go to the nearest emergency room

## 2014-06-20 NOTE — Progress Notes (Signed)
[facility]    Provider:  Dr Jaynee Eagles Referring Provider: Vickii Penna., MD Primary Care Physician:  Vickii Penna., MD  CC:  Muscle Weakness  HPI:  Gina Golden is a 38 y.o. female here as a referral from Dr. Tammi Klippel for Muscle Weakness  Gina Golden is a 38 year old female who is here for a new evaluation of fatigue and muscle weakness that started after ear surgery in June. The fatigue worsened and 3 weeks ago her muscles started "quivering" when she is extremely fatigued. She tried to go out and couldn't even hold herself up. Her legs 'didn't hardly work" she could only take 5 steps at a time and needed her friends to help her. She would have to sit and rest. The symptoms used to be worse at night but now her leg starts trembling within 15 minutes of getting up in the morning. Patient recorded the trembling and shows her bilateral quads with tremors, no fasciculations noted. She also describes blurred vision and unsteady balance, "swiss cheese" brain and word-finding difficulties. She has a history of headaches in the past and says she had her first migraine recently. Has had 4 since July and tried imitrex which works. She stopped working 52 days agi because the fatigue is so severe and she is trying to get short-term disability. She is napping 3x a day for up to an hour which helps the fatigue and weakness. Also sleeping 8-10 hours at night. She also has episodic slurred speech when she is very tired. Yesterday she was on the phone and she was so slurred that people thought she was drunk because she was slurring every 4th or 5th word but she was groggy from a nap and extremely fatigued. The fatigue is better in the morning and worse with exertion or later in the day. But the fatigue and weakness is there all day.   She thought she had MS 10 years ago but workup was negative. MRI at that time was negative. Went to the emergency room yesterday for these symptoms.    Denies diplopia but says her vision is "blurred" which varies and can't localize to time of day. +slurred speech. Denies difficulty swallowing. Denies fatiguable chewing. Has droopy eyelids when tired right>left. No SOB. Has had difficulty keeping head up. No sensory changes. No new skin rashes or changes.   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 Cerebellar tonsils maintain a  normal rounded shape.  There is no acute infarct. Ventricles and sulci are normal for age.  There is no evidence of intracranial hemorrhage, mass, midline  shift, or extra-axial fluid collection. No brain parenchymal signal  abnormality is identified.  Orbits are unremarkable. Minimal bilateral maxillary sinus mucosal  thickening and trace bilateral mastoid effusions are noted. Major  intracranial vascular flow voids are preserved. Calvarium and scalp  soft tissues are unremarkable.  IMPRESSION:  1. No acute intracranial abnormality.  2. Unchanged cerebellar tonsillar ectopia, otherwise unremarkable  appearance of the brain.  Review of Systems: Out of a complete 14 system review, the patient complains of only the following symptoms, and all other reviewed systems are negative. Blurred vision, fatigue, feeling hot, hearing loss, red blood in stool, allergies, skin sensitivity, headache, weakness, slurred speech, tremor,  sleepiness, decreased energy  History   Social History  . Marital Status: Divorced    Spouse Name: N/A    Number of Children: 0  . Years of Education: N/A   Occupational History  .  [name]   Social History Main Topics  . Smoking status: Current Every Day Smoker -- 0.50 packs/day    Types: Cigarettes    Start date:  11/08/2005  . Smokeless tobacco: Never Used  . Alcohol Use: 1.2 oz/week    2 Shots of liquor per week     Comment: social   . Drug Use: No  . Sexual Activity: Yes    Birth Control/ Protection: Condom, IUD   Other Topics Concern  . Not on file   Social History Narrative   Patient is single with no children.   Patient is right handed.   Patient has 2 Associates's degree.   Patient drinks 3-5 cups daily.    Family History  Problem Relation Age of Onset  . Cancer Father   . Heart disease Mother   . Heart disease Maternal Grandmother     Past Medical History  Diagnosis Date  . PCOS (polycystic ovarian syndrome)   . Hypothyroidism   . Depression   . Adult ADHD   . Allergy   . Endometriosis   . Psoriasis   . Status post ear surgery     Past Surgical History  Procedure Laterality Date  . Mastoidectomy  2007  . Breast lumpectomy  2010    right  . Mastectomy, partial  2010    left  . Adenoidectomy    . Laprosc      Current Outpatient Prescriptions  Medication Sig Dispense Refill  . levothyroxine (SYNTHROID) 50 MCG tablet Take 50 mcg by mouth every evening.       . loratadine (CLARITIN) 10 MG tablet Take 10 mg by mouth daily.      . mometasone (NASONEX) 50 MCG/ACT nasal spray Place 2 sprays into the nose daily.       Marland Kitchen ofloxacin (FLOXIN) 0.3 % otic solution Place 5 drops into the left ear 2 (two) times daily.      . valACYclovir (VALTREX) 1000 MG tablet Take 1,000 mg by mouth daily as needed (outbreaks).       . clobetasol ointment (TEMOVATE) 5.64 % Apply 1 application topically 2 (two) times daily as needed (rash, weekly).       Marland Kitchen HYDROcodone-acetaminophen (NORCO/VICODIN) 5-325 MG per tablet Take 1 tablet by mouth every 6 (six) hours as needed for moderate pain.  15 tablet  0  . SUMAtriptan (IMITREX) 100 MG tablet Take 100 mg by mouth every 2 (two) hours as needed for migraine or headache. May repeat in 2 hours if headache persists or recurs.       No current  facility-administered medications for this visit.    Allergies as of 06/20/2014 - Review Complete 06/20/2014  Allergen Reaction Noted  . Effexor [venlafaxine hydrochloride] Other (See Comments) 02/10/2012  . Levaquin [levofloxacin in d5w] Shortness Of Breath, Itching, and Swelling 06/05/2014  . Sulfa drugs cross reactors Itching and Swelling 02/10/2012  . Tetracyclines & related Shortness Of Breath 02/10/2012  . Dilaudid [hydromorphone] Itching 06/19/2014  . Ibuprofen Other (See Comments) 06/19/2014  . Naproxen Other (See Comments) 06/19/2014  . Ceclor [cefaclor] Rash 02/10/2012    Vitals: BP 126/86  Pulse 94  Ht 5\' 5"  (1.651 m)  Wt 217 lb (98.431 kg)  BMI 36.11 kg/m2 Last Weight:  Wt Readings from Last 1 Encounters:  06/20/14 217 lb (98.431 kg)   Last Height:   Ht Readings from Last 1 Encounters:  06/20/14 5\' 5"  (1.651 m)     Physical exam: Exam: Gen: NAD, conversant, obese Eyes: anicteric sclerae, moist conjunctivae HENT: Atraumatic, oropharynx clear Neck: Trachea midline; supple,  Lungs: CTA, no wheezing, rales, rhonic                          CV: RRR, no MRG Abdomen: Soft, non-tender;  Extremities: No peripheral edema  Skin: Normal temperature, no rash,  Psych: Appropriate affect, pleasant  Neuro: MS: AA&Ox3, appropriately interactive, normal affect   Speech: fluent w/o paraphasic error or dysarthria  Memory: good recent and remote recall  CN: PERRL, fundi flat, EOMI no nystagmus, no ptosis, EOM not fatiguable, no ptosis on sustained upgaze,  sensation intact to LT V1-V3 bilat, face symmetric, no weakness, hearing grossly intact, palate elevates symmetrically, shoulder shrug 5/5 bilat, tongue protrudes midline, no fasiculations noted.  Motor: normal bulk and tone Strength: 5/5  In all extremities as well as head extension and flexion  Coord: rapid alternating and point-to-point (FNF, HTS) movements intact.  Reflexes: symmetrical, bilat downgoing  toes  Sens: LT intact in all extremities  Gait: posture, stance, stride and arm-swing normal. Tandem gait intact. Able to walk on heels and toes. Romberg absent.   Assessment:  38 year old female who is here for evaluation of fatigue and weakness. Patient descibes blurring, dysarthria, muscle weakness and ptosis that is better in the morning and after naps and worse as the day goes on or with exertion. Neuro exam was normal without fatiguable ptosis or fatiguable eye movements and with normal strength although symptoms are concerning for neuromuscular junction disorder such as myasthenia gravis. No concerns at this time for myasthenic crisis although discussed with patient if symtpoms worsen considerably or she has worsening shortness of breath that she should call 911 or proceed to emergency room directly. Counseled patient on smoking cessation, she will discuss with primary care physician.   Plan:  Will order AchR antibodies and voltage-gated calcium channel antibodies for myasthenia gravis and lambert eaton. Ordered an EMG/NCS to include repetitive nerve stimulation.     Sarina Ill, MD  [facility] [address] [place], [place] [address]  Phone [phone] Fax [phone]

## 2014-06-24 ENCOUNTER — Ambulatory Visit (INDEPENDENT_AMBULATORY_CARE_PROVIDER_SITE_OTHER): Payer: BC Managed Care – PPO | Admitting: Neurology

## 2014-06-24 ENCOUNTER — Encounter (INDEPENDENT_AMBULATORY_CARE_PROVIDER_SITE_OTHER): Payer: BC Managed Care – PPO

## 2014-06-24 DIAGNOSIS — R531 Weakness: Secondary | ICD-10-CM

## 2014-06-24 DIAGNOSIS — R5381 Other malaise: Secondary | ICD-10-CM

## 2014-06-24 DIAGNOSIS — R5383 Other fatigue: Secondary | ICD-10-CM

## 2014-06-24 DIAGNOSIS — Z0289 Encounter for other administrative examinations: Secondary | ICD-10-CM

## 2014-06-25 ENCOUNTER — Telehealth: Payer: Self-pay | Admitting: Neurology

## 2014-06-25 NOTE — Telephone Encounter (Signed)
Patient requesting blood work results.  Please call anytime and leave message if not available.

## 2014-06-26 NOTE — Progress Notes (Signed)
[facility]  Provider: Dr Jaynee Eagles  Referring Provider: Vickii Penna., MD  Primary Care Physician: Vickii Penna., MD  CC: Muscle Weakness   History:  Gina Golden is a 38 y.o. female here as a referral from Dr. Tammi Klippel for Muscle Weakness. Gina Golden is a 38 year old female who is here for a new evaluation of fatigue and muscle weakness that started after ear surgery in June. The fatigue worsened and 3 weeks ago her muscles started "quivering" when she is extremely fatigued. She tried to go out and couldn't even hold herself up. Her legs 'didn't hardly work" she could only take 5 steps at a time and needed her friends to help her. She would have to sit and rest. The symptoms used to be worse at night but now her leg starts trembling within 15 minutes of getting up in the morning. Patient recorded the trembling and shows her bilateral quads with tremors, no fasciculations noted. She also describes blurred vision and unsteady balance, "swiss cheese" brain and word-finding difficulties. She has a history of headaches in the past and says she had her first migraine recently. Has had 4 since Golden and tried imitrex which works. She stopped working 52 days agi because the fatigue is so severe and she is trying to get short-term disability. She is napping 3x a day for up to an hour which helps the fatigue and weakness. Also sleeping 8-10 hours at night. She also has episodic slurred speech when she is very tired. Yesterday she was on the phone and she was so slurred that people thought she was drunk because she was slurring every 4th or 5th word but she was groggy from a nap and extremely fatigued. The fatigue is better in the morning and worse with exertion or later in the day. But the fatigue and weakness is there all day.  She thought she had MS 10 years ago but workup was negative. MRI at that time was negative. Went to the emergency room yesterday for these symptoms.  Denies  diplopia but says her vision is "blurred" which varies and can't localize to time of day. +slurred speech. Denies difficulty swallowing. Denies fatiguable chewing. Has droopy eyelids when tired right>left. No SOB. Has had difficulty keeping head up. No sensory changes. No new skin rashes or changes.   Summary: 3 Hz Repetitive Nerve Stimulation was performed on the right Abductor Pollicis Brevis and the right Trapezius muscles at rest and 30 seconds, 1 minute, 2 minutes and 4 minutes after one minute of isometric exercise. No significant decrement was seen.  Rapid (30-Hz) repetitive stimulation was performed on the right Abductor Pollicis Brevis and showed no significant increment. All nerve conduction studies (as indicated in the following attachment) were within normal limits.  All examined muscles (as indicated in the following table) were normal.    Conclusion: This is a normal study. No electrophysiologic evidence for polyneuropathy or myopathy. Repetitive Nerve Stimulation failed to reveal any significant decrement to support Myasthenia Gravis or increment to support [name] Myasthenic Syndrome. Clinical correlation recommended.   EMG Side Muscle Nerve Root Ins Act Fibs Psw Amp Dur Poly Recrt Fascics  Right Deltoid Axillary C5-6 Nml Nml Nml Nml Nml Nml Nml Nml  Right Triceps Radial C6-7-8 Nml Nml Nml Nml Nml Nml Nml Nml  Right Biceps Musculocut C5-6 Nml Nml Nml Nml Nml Nml Nml Nml  Right 1stDorInt Ulnar C8-T1 Nml Nml Nml Nml Nml Nml Nml Nml  Right AntTibialis  Dp Br Peron L4-5 Nml Nml Nml Nml Nml Nml Nml Nml  Right MedGastroc Tibial S1-2 Nml Nml Nml Nml Nml Nml Nml Nml  Right VastusMed Femoral L2-4 Nml Nml Nml Nml Nml Nml Nml Nml  Right Iliopsoas Femoral L2-3 Nml Nml Nml Nml Nml Nml Nml Nml

## 2014-06-27 LAB — ACETYLCHOLINE RECEPTOR, BINDING

## 2014-06-27 LAB — ACETYLCHOLINE RECEPTOR, BLOCKING: AChR Blocking Abs, Serum: 20 % (ref 0–25)

## 2014-06-27 LAB — ACETYLCHOLINE RECEPTOR, MODULATING

## 2014-06-27 LAB — VGCC ANTIBODY: VGCC ANTIBODY: NEGATIVE

## 2014-06-27 NOTE — Telephone Encounter (Signed)
Patient calling to state that she received a notice on Mychart that her bloodwork results are ready, is waiting on Dr. Cathren Golden call anxiously regarding these results, please call her back and advise.

## 2014-06-27 NOTE — Telephone Encounter (Signed)
Patient is requesting a telephone call to go over the other labs that are back now.  Please advise.

## 2014-06-28 NOTE — Progress Notes (Signed)
Quick Note:  Patient has been contacted please see phone note dated 06/28/14. ______

## 2014-06-28 NOTE — Addendum Note (Signed)
Addended by: Sarina Ill B on: 06/28/2014 09:57 AM   Modules accepted: Orders

## 2014-06-28 NOTE — Telephone Encounter (Signed)
Spoke with patient, relayed blood work results, informed patient that she was being referred to [facility] for additional questions, patient had some questions about some research she did and was requesting to have Anti NUSK testing done. Patient also concern about her job, presently out of work and request to get some disability forms in order. Patient requesting a return call.

## 2014-06-28 NOTE — Telephone Encounter (Signed)
Patient calling again to state that she is still anxiously waiting on these bloodwork results, states that her symptoms have magnified and that her speech is still slurred. Please return call to patient and advise.

## 2014-06-30 NOTE — Telephone Encounter (Signed)
Discussed with patient, will order additional bloodwork and call her back.

## 2014-07-02 NOTE — Telephone Encounter (Signed)
Calling again to see what the next step is.  She was expecting a call today and is still waiting.  Please call

## 2014-07-04 ENCOUNTER — Telehealth: Payer: Self-pay | Admitting: Neurology

## 2014-07-04 NOTE — Telephone Encounter (Signed)
Consulted Dr Jaynee Eagles about appt in 10/2014.  Was ok to get first available.  Made appt for 08-01-14 at 0900 with Dr. Mellody Drown.  She may receive letter about 11-04-14 appt, please disregard and know other letter to follow for the 08-01-14 appt.

## 2014-07-04 NOTE — Telephone Encounter (Signed)
   Spoke with  Tora Duck, [place], [facility]  Job Accommodation Specialist  [place]. PHONE [phone]  FAX [phone]  Email [email or web address] Regarding paperwork that Duaa Stelzner has requested be filled out for disability benefits. Discussed that patient is being evaluated for neurologic disorders however there is currently no diagnosis. Per Ms Roby Lofts can fill out form to the best of physician's abilities given patient's reported severe complaints  I filled out document and faxed with supporting documentation to Ms. Sydow.

## 2014-07-04 NOTE — Telephone Encounter (Signed)
I called pt and gave her information about [facility] re: to her lab work code 482 MUSK antibody Test.  Cost out of pocket $1620.00.   [employer] out of network for lab per [place].  Relayed to pt.  Gave her the information about [employer] program if qualifies.  I called and spoketo Butch Penny at [facility], they do not do outside [facility], also Dr. Vallarie Mare at [facility] made appt for 11-04-14 at 1300.  If need earlier appt may call back.  Pt to receive new pt letter in mail .  Pt was given information about lab work for her to call her [place] (only insurance), also may speak to [employer].

## 2014-07-04 NOTE — Telephone Encounter (Signed)
I called pt back.   Appt for Dr. Ileene Rubens given to her 08-01-14 at 0900.  Also she is working with [employer] for lab to do this antiMUSK antibody.  She will call tomorrow or could be Monday when gets more information re this.

## 2014-07-05 ENCOUNTER — Telehealth: Payer: Self-pay | Admitting: Neurology

## 2014-07-05 NOTE — Telephone Encounter (Signed)
I emailed to her [email or web address] copy of her disabiity papers, per Milon Dikes in MR.

## 2014-07-05 NOTE — Telephone Encounter (Signed)
Pt calling back wanting copy of disability papers that Dr. Jaynee Eagles filled out. Will email [email or web address].  Pt aware.

## 2014-07-05 NOTE — Telephone Encounter (Signed)
Patient asked me for my email address to send me a blank form for her disability paperwork. I explained to patient that the email address I provided to her was solely for the purpose of emailing me blank forms once and that it is not a secure email so no medical information should be passed through this forum. She voiced understanding. I responded to patient's email last evening letting her know that the forms had been filled out and faxed to her company. Also informed her that I do not monitor the email address I provided to her and that any and all further correspondence should come through the office.

## 2014-07-08 NOTE — Telephone Encounter (Signed)
Patient has been reading up on myasthenia gravis and says that she has figured it all out and she had the "perfect storm created in my body". She was using antibiotic drops for her ears when the symptoms started and she stopped using them on the 15th of august. She has been feeling substantially better the last 3 days and attributes it to stopping the ear drops. These ear drops are listed on the myasthenia.org website as possibly exacerbating myasthenia gravis. She had been on multiple antibiotics in the past with multiple reactions that she feels now was myasthenia gravis exacerbation.  She has been doing a lot of reading and feels the antibiotic drops in her ears had triggered myasthenia gravis in her body. She has been looking in her past and now remembers taking tetracycline which caused SOB as a teenager, and recent reaction to Levaquin was probably medication interacting with myasthenia gravis as well.  She felt like the  "heavens opened up and a light shined on me" when she figured all of this out.   She is feeling significantly better and wants to go back to work on Friday because she is not getting paid while she is out. I told her I am happy to write her a letter with certain accommodations if she is feeling that much better. I gave her the fax number and asked her to fax requirements from her company for her return to work.    I encouraged her to keep her appointment with [facility] in September. Discussed that it would be in her best interest to transfer care to [facility] so they may evaluate her for Myasthenia Gravis and try to make a diagnosis. An academic center such as [facility] has more specialized tests they can do like single-fiber emg that our practice does not offer.

## 2014-07-08 NOTE — Telephone Encounter (Signed)
Pt called me this am.  Stated she had breakthru.  Wanted appt to discuss with Dr. Jaynee Eagles.  Made appt for 07-10-14 at 0930.

## 2014-07-09 ENCOUNTER — Telehealth: Payer: Self-pay | Admitting: *Deleted

## 2014-07-09 NOTE — Telephone Encounter (Signed)
Gina Golden - Please fax Gina Golden's letter to the following person. Then please call Gina Golden and inform her that it was faxed. Thanks  Tora Duck, [place], [facility]  Job Accommodation Specialist  [place]. PHONE [phone]  FAX [phone]  Email [email or web address]

## 2014-07-09 NOTE — Telephone Encounter (Signed)
Called patient to find out where she would like to have her letter faxed, patient stated that she does not have the fax number but has requested the letter be faxed to Tora Duck at [email or web address], and request herself to be included, patient's email address [email or web address]. Letter was written by Dr Jaynee Eagles, however it is not in the chart, I have the copy at my desk, patient states that Lovey Newcomer has scanned a document in for her before, is this something that can be done, [place] please advise.

## 2014-07-10 ENCOUNTER — Ambulatory Visit: Payer: 59 | Admitting: Obstetrics & Gynecology

## 2014-07-10 ENCOUNTER — Ambulatory Visit: Payer: Self-pay | Admitting: Neurology

## 2014-07-10 NOTE — Telephone Encounter (Signed)
Patient calling to speak to [facility] regarding getting a copy of this letter for herself emailed to her, so that she can take it to work on Friday. Please return call and advise.

## 2014-07-10 NOTE — Telephone Encounter (Signed)
I emailed pt, copy of letter to her at [email or web address].

## 2014-07-10 NOTE — Telephone Encounter (Signed)
Thank you Dr. Jaynee Eagles, letter has been faxed.

## 2014-07-18 ENCOUNTER — Ambulatory Visit: Payer: BC Managed Care – PPO | Admitting: Obstetrics & Gynecology

## 2014-07-19 ENCOUNTER — Telehealth: Payer: Self-pay | Admitting: *Deleted

## 2014-07-19 ENCOUNTER — Ambulatory Visit: Payer: BC Managed Care – PPO | Admitting: Neurology

## 2014-07-19 NOTE — Telephone Encounter (Signed)
I called pt and I relayed that if she is having issues catching her breath, weakness, she needs to call 911 or go to ER.  She is aware.  Made appt with pt for 0830 on Monday 07-22-14 for evaluation.    Will address work related notes at that time.  [facility] app 08-01-14.  Pt aware.  I noted appt with Dr. Delice Lesch same day as well? (noted after speaking with pt).   Pt can get lab requisition when in on Monday for anti musk antibody.

## 2014-07-19 NOTE — Telephone Encounter (Signed)
Received call from pt about having slurred speech and not being able to work for possible MG.  Deferred to [place] to address.  [phone]

## 2014-07-19 NOTE — Telephone Encounter (Signed)
Please tell patient if she needs additional documentation, she will need to be seen in the office. Thanks.

## 2014-07-19 NOTE — Telephone Encounter (Signed)
If patient is short of breath or has other severe worsening symptoms, she need to go the emergency room to be evaluated. Otherwise she can be seen in the office first available. thanks

## 2014-07-19 NOTE — Telephone Encounter (Signed)
I called pt.  She worked Friday, Sat , sun of last week.  Mon was off.  Tues worked all day but had arm spasms, then Wednesday was off, woke up with migraine, took sumatriptan and this helped migraine, Thursday was off, laid around , rest.  Woke up on Friday, cough, throat itching, chest , breast bone (hard to take a breath).  Legs shaky.  Speech transient tremory while speaking to her.  Friday due back at work.  Needs note (addendum) that sx presenting again, speech impairment, muscle spasms.  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  Ok to get lab ([place] [facility] or [facility]).

## 2014-07-22 ENCOUNTER — Encounter: Payer: Self-pay | Admitting: Neurology

## 2014-07-22 ENCOUNTER — Ambulatory Visit (INDEPENDENT_AMBULATORY_CARE_PROVIDER_SITE_OTHER): Payer: BC Managed Care – PPO | Admitting: Neurology

## 2014-07-22 VITALS — BP 117/81 | HR 106 | Ht 65.5 in | Wt 219.0 lb

## 2014-07-22 DIAGNOSIS — R251 Tremor, unspecified: Secondary | ICD-10-CM

## 2014-07-22 DIAGNOSIS — R109 Unspecified abdominal pain: Secondary | ICD-10-CM

## 2014-07-22 DIAGNOSIS — T731XXA Deprivation of water, initial encounter: Secondary | ICD-10-CM

## 2014-07-22 DIAGNOSIS — G894 Chronic pain syndrome: Secondary | ICD-10-CM

## 2014-07-22 DIAGNOSIS — IMO0001 Reserved for inherently not codable concepts without codable children: Secondary | ICD-10-CM

## 2014-07-22 DIAGNOSIS — R259 Unspecified abnormal involuntary movements: Secondary | ICD-10-CM

## 2014-07-22 NOTE — Patient Instructions (Signed)
Overall you are doing fairly well but I do want to suggest a few things today:   Remember to drink plenty of fluid, eat healthy meals and do not skip any meals. Try to eat protein with a every meal and eat a healthy snack such as fruit or nuts in between meals. Try to keep a regular sleep-wake schedule and try to exercise daily, particularly in the form of walking, 20-30 minutes a day, if you can.   As far as diagnostic testing: labwork today as discussed  Please also call us for any test results so we can go over those with you on the phone.  My clinical assistant and will answer any of your questions and relay your messages to me and also relay most of my messages to you.   Our phone number is [phone]. We also have an after hours call service for urgent matters and there is a physician on-call for urgent questions. For any emergencies you know to call 911 or go to the nearest emergency room

## 2014-07-22 NOTE — Progress Notes (Signed)
[facility]    Provider:  Dr Jaynee Eagles Referring Provider: Vickii Penna., MD Primary Care Physician:  Vickii Penna., MD  CC:  weakness  HPI:  Gina Golden is a 38 y.o. female here as a referral from Dr. Tammi Klippel for weakness. PMHx depression, PCOS, hypothyroidism, headaches, adult ADHD, endometriosis, lumpectomy and mastectomy,  She returns today for evaluation of worsening symptoms. She was on disability from work and returned due to financial strain but reports worsening difficulties and would like disability evaluation due progression of symptoms.   Patient was first seen in the office approx a month ago in August 2015 with the following symptoms:  fatigue and muscle weakness, muscles "quivering", severe weakness where her legs 'didn't hardly work", symptoms used to be worse at night but progressed where leg starts trembling within 15 minutes of getting up in the morning, also with  blurred vision and unsteady balance, "swiss cheese" brain and word-finding difficulties, episodic slurred speech ,  feeling hot, hearing loss, skin sensitivity, headache,  tremor, sleepiness, decreased energy.  She reported the fatigue so severe that she was in the process of trying to get disability because she could not work.  Patient underwent testing for Myasthenia gravis including bloodwork and EMG/NCS which was negative. Neurologic exams in the office were normal without fatiguable ptosis or fatiguable extra occular movements. MRI of the brain was unremarkable. Patient was referred to [facility] for single-fiber EMG.    Today she returns to the office with progression of previous symptoms plus new neurologic complaints. She shows me a video of her great toe moving back and forth (flexion/extension) and reports this is a new symptom. She started having jumping in the muscles of the bilateral forearms. It "looked like I had Parkinson's". She could stop the muscle movements if she "thought  about it real hard",  speech difficulty with pitch problems. She also couldn't see the computer screen well, she had to increase the font to see the letters but no diplopia. She described going home and sleeping for 18 hours,  She woke up and was "mopey all day".  She tried to go to work again but as soon as she woke up she had a problem with her voice, it kept "going up and down".  She feels like she is slurring right now as well. Last time she took hydrocodone was more than a month. She is not taking her thyroid medicine anymore because she is scared to take medication with her multitude of neurologic symptoms. She is taking allergy medications. She also reports that after putting clobetasol on her arms for a rash her tongue feels numb. She has fingertip numbness in all the fingers. She initially denied drug use but, after I requested a urine drug screen, she stated that she smoked marijuana recently. Also reported chronic abdominal pain.   Labwork revealed: HgbA1c 5.9, ANA positive (negative one month previous), positive ENA RNP Ab, EBV IgG positive, EBV VCA IgM WNL, EBV EA IgG, positive, EBV NA IgG WNL, CMV IgM WNL,   unrmarkable Labs: ceruloplasmin, urinary drug screen, TSH, copper, dsDNA, ENA Sm Ab, SSA/SSB, VGCC antibody, AchR Ab (binding, blocking, modulating), CBC, CMP, CK (33), Lyme IgG, IgM, B12(361), VitD 25-oh 44,   Pending; urinary porphyrins and porphorobilinogen, Anti-MuSK  MRi of the brain unremarkable.  EMG/NCS Conclusion: This is a normal study. No electrophysiologic evidence for polyneuropathy or myopathy. Repetitive Nerve Stimulation failed to reveal any significant decrement to support Myasthenia Gravis or increment to  support [name] Myasthenic Syndrome. Clinical correlation recommended.  Review of Systems: Patient complains of symptoms per HPI as well as the following symptoms activity change, appetite change, fatigue, excessive thrist, blurred vision, cough, ear discharge,  SOB, frequent infections, headache, numbness, speech difficulty, weakness, tremors, walking difficulty, daytime sleepiness. Voice pitch control, slurring, extreme fatigue. Pertinent negatives per HPI. Otherwise out of a complete 14 system review, and all other reviewed systems are negative.   History   Social History  . Marital Status: Divorced    Spouse Name: N/A    Number of Children: 0  . Years of Education: 12+   Occupational History  .  [name]   Social History Main Topics  . Smoking status: Current Every Day Smoker -- 0.50 packs/day    Types: Cigarettes    Start date: 11/08/2005  . Smokeless tobacco: Never Used  . Alcohol Use: 1.2 oz/week    2 Shots of liquor per week     Comment: social   . Drug Use: No  . Sexual Activity: Yes    Birth Control/ Protection: Condom, IUD   Other Topics Concern  . Not on file   Social History Narrative   Patient is single with no children.   Patient is right handed.   Patient has 2 Associates's degree.   Patient drinks 3-5 cups daily.    Family History  Problem Relation Age of Onset  . Cancer Father   . Heart disease Mother   . Heart disease Maternal Grandmother     Past Medical History  Diagnosis Date  . PCOS (polycystic ovarian syndrome)   . Hypothyroidism   . Depression   . Adult ADHD   . Allergy   . Endometriosis   . Psoriasis   . Migraine     Past Surgical History  Procedure Laterality Date  . Mastoidectomy  2007  . Breast lumpectomy  2010    right  . Mastectomy, partial  2010    left  . Adenoidectomy  1979  . Laprosc    . Inner ear surgery  1979, 2015    STATUS POST EAR SURGERY    Current Outpatient Prescriptions  Medication Sig Dispense Refill  . clobetasol ointment (TEMOVATE) 8.84 % Apply 1 application topically 2 (two) times daily as needed (rash, weekly).       Marland Kitchen levothyroxine (SYNTHROID, LEVOTHROID) 50 MCG tablet Take by mouth.      . loratadine (CLARITIN) 10 MG tablet Take 10 mg by mouth  daily.      . mometasone (NASONEX) 50 MCG/ACT nasal spray Place 2 sprays into the nose daily.       . SUMAtriptan (IMITREX) 100 MG tablet Take 100 mg by mouth every 2 (two) hours as needed for migraine or headache. May repeat in 2 hours if headache persists or recurs.      . valACYclovir (VALTREX) 1000 MG tablet Take 1,000 mg by mouth daily as needed (outbreaks).        No current facility-administered medications for this visit.    Allergies as of 07/22/2014 - Review Complete 07/22/2014  Allergen Reaction Noted  . Effexor [venlafaxine hydrochloride] Other (See Comments) 02/10/2012  . Levaquin [levofloxacin in d5w] Shortness Of Breath, Itching, and Swelling 06/05/2014  . Sulfa drugs cross reactors Itching and Swelling 02/10/2012  . Tetracycline Shortness Of Breath 07/22/2014  . Tetracyclines & related Shortness Of Breath 02/10/2012  . Venlafaxine Anaphylaxis and Other (See Comments) 09/10/2013  . Dilaudid [hydromorphone] Itching 06/19/2014  .  Ibuprofen Other (See Comments) 06/19/2014  . Naproxen Other (See Comments) 06/19/2014  . Other Itching 07/22/2014  . Sulfa antibiotics Itching 07/22/2014  . Ceclor [cefaclor] Rash 02/10/2012    Vitals: BP 117/81  Pulse 106  Ht 5' 5.5" (1.664 m)  Wt 219 lb (99.338 kg)  BMI 35.88 kg/m2 Last Weight:  Wt Readings from Last 1 Encounters:  07/22/14 219 lb (99.338 kg)   Last Height:   Ht Readings from Last 1 Encounters:  07/22/14 5' 5.5" (1.664 m)    Physical exam: Exam: Gen: NAD, mildly pressured speech. Eyes: anicteric sclerae, moist conjunctivae HENT: Atraumatic, oropharynx clear Neck: Trachea midline; supple,  Lungs: CTA, no wheezing, rales, rhonic                          CV: RRR, no MRG Abdomen: Soft, non-tender; Obese Extremities: No peripheral edema  Skin: Normal temperature, no rash,  Psych: Appropriate affect, pleasant  Neuro: Detailed Neurologic Exam  Speech:    Speech is normal; fluent and spontaneous with normal  comprehension.  Cognition:    The patient is oriented to person, place, and time; memory intact; language fluent; normal attention, concentration, and fund of knowledge.  Cranial Nerves:    The pupils are equal, round, and reactive to light. The fundi are normal and spontaneous venous pulsations are present. Visual fields are full to finger confrontation. Extraocular movements are intact. Trigeminal sensation is intact and the muscles of mastication are normal. The face is symmetric. The palate elevates in the midline. Voice is normal. Shoulder shrug is normal. The tongue has normal motion without fasciculations. No fatiguable ptosis or EOM.   Coordination:    Normal finger to nose and heel to shin. Normal rapid alternating movements.   Gait:    Heel-toe and tandem gait are normal.   Motor Observation:    No asymmetry, no atrophy, and no involuntary movements noted. Tone:    Normal muscle tone.    Posture:    Posture is normal. normal erect    Strength:    Strength is V/V in the upper and lower limbs.       Vibratory Sensation:    Normal vibratory sensation in upper and lower extremities.   Light Touch:    Normal light touch sensation in upper and lower extremities.     Proprioception:    Normal proprioception in upper and lower extremities.  Pin Prick:    Normal sensation to pinprick in upper and lower extremities.    Temperature:    Normal temperature sensation in upper and lower extremities.  Reflex Exam:  DTR's:    Deep tendon reflexes in the upper and lower extremities are normal bilaterally.   Toes:    The toes are downgoing bilaterally.   Clonus:    Clonus is absent.   Assessment/Plan:  (For a full history please see HPI). Patient is a 38 year old female PMHx depression, PCOS, hypothyroidism, headaches, adult ADHD, endometriosis, lumpectomy and mastectomy who is here for a multitude of symptoms including: extreme fatigue and muscle weakness, muscles  "quivering", severe weakness where her legs 'didn't hardly work",   blurred vision and unsteady balance, "swiss cheese" brain and word-finding difficulties,  feeling hot, hearing loss, skin sensitivity, headache,  tremor, sleepiness, decreased energy, her toe moving back and forth, jumping in the muscles of the bilateral forearms, problem with her voice  "going up and down", after putting clobetasol on her arms for a rash  her tongue feels numb,numbness in all the fingers, chronic abdominal pain, activity change, appetite change, excessive thrist, SOB, headache, numbness, tremors, walking difficulty, daytime sleepiness.  She reported the fatigue so severe that she is in the process of trying to get disability because she can not work.  Given her neurologic symptoms in particular the weakness, fatigue, slurred speech, blurred vision she was evaluate for myasthenia gravis. She was referred to rheumatology for abnormal lab tests. She was referred to [facility] for single-fiber emg and further evaluation. Cannot rule out that she does have an underlying neurologic diagnosis however there appears to be psychiatric component. She has been referred to another neurologist in the practice for a second opinion. She also has an appointment scheduled at [facility] neurology and she has also made an appointment with [facility] neurology.   Clinical Exam: All neurologic exams have been normal.   Workup:   Patient underwent testing for Myasthenia gravis including bloodwork and EMG/NCS which was negative. Neurologic exams in the office were normal without fatiguable ptosis or fatiguable extra occular movements. MRI of the brain was unremarkable. Patient was referred to [facility] for single-fiber EMG.    EMG/NCS Conclusion: This is a normal study. No electrophysiologic evidence for polyneuropathy or myopathy. Repetitive Nerve Stimulation failed to reveal any significant decrement to support Myasthenia Gravis or increment to  support [name] Myasthenic Syndrome. Clinical correlation recommended.   Labwork revealed: HgbA1c 5.9, ANA positive (negative one month previous), positive ENA RNP Ab, EBV IgG positive, EBV VCA IgM WNL, EBV EA IgG, positive, EBV NA IgG WNL, CMV IgM WNL. Patient referred to rheumatology.   unrmarkable Labs: ceruloplasmin, urinary drug screen, TSH, copper, dsDNA, ENA Sm Ab, SSA/SSB, VGCC antibody, AchR Ab (binding, blocking, modulating), CBC, CMP, CK (33), Lyme IgG, IgM, B12(361), VitD 25-oh 44,   Pending; urinary porphyrins and porphorobilinogen, Anti-MuSK  MRi of the brain unremarkable.  A total of 75 minutes was spent in with this patient. Over half this time was spent on counseling patient on the diagnosis and different therapeutic options available.    Sarina Ill, MD  [facility] [address] [place], [place] [address]  Phone [phone] Fax [phone]

## 2014-07-23 ENCOUNTER — Other Ambulatory Visit: Payer: Self-pay | Admitting: Neurology

## 2014-07-23 ENCOUNTER — Telehealth: Payer: Self-pay | Admitting: Neurology

## 2014-07-23 DIAGNOSIS — R899 Unspecified abnormal finding in specimens from other organs, systems and tissues: Secondary | ICD-10-CM

## 2014-07-23 LAB — SPECIMEN STATUS REPORT

## 2014-07-23 LAB — PORPHYRINS, QN, RANDOM U

## 2014-07-23 LAB — PORPHOBILINOGEN, RANDOM URINE

## 2014-07-23 NOTE — Telephone Encounter (Signed)
Have asked Gina Golden to call patient and let her know that we were able to send out all the tests we discussed using the blood and urine specimens she provided after her appointment; we do need her to visit the lab again this week. Thanks.

## 2014-07-24 ENCOUNTER — Encounter: Payer: Self-pay | Admitting: Neurology

## 2014-07-24 LAB — PAIN MGT SCRN (14 DRUGS), UR
Amphetamine Screen, Ur: NEGATIVE ng/mL
Barbiturate Screen, Ur: NEGATIVE ng/mL
Benzodiazepine Screen, Urine: NEGATIVE ng/mL
Buprenorphine, Urine: NEGATIVE ng/mL
CREATININE(CRT), U: 58.5 mg/dL (ref 20.0–300.0)
Cannabinoids Ur Ql Scn: NEGATIVE ng/mL
Cocaine(Metab.)Screen, Urine: NEGATIVE ng/mL
FENTANYL+NORFENTANYL UR QL SCN: NEGATIVE pg/mL
MEPERIDINE UR QL: NEGATIVE ng/mL
Methadone Scn, Ur: NEGATIVE ng/mL
Opiate Scrn, Ur: NEGATIVE ng/mL
Oxycodone+Oxymorphone Ur Ql Scn: NEGATIVE ng/mL
PCP Scrn, Ur: NEGATIVE ng/mL
Ph of Urine: 5.6 (ref 4.5–8.9)
Propoxyphene, Screen: NEGATIVE ng/mL
Tramadol Ur Ql Scn: NEGATIVE ng/mL

## 2014-07-24 LAB — URINALYSIS
Bilirubin, UA: NEGATIVE
GLUCOSE, UA: NEGATIVE
Ketones, UA: NEGATIVE
LEUKOCYTES UA: NEGATIVE
Nitrite, UA: NEGATIVE
PROTEIN UA: NEGATIVE
RBC, UA: NEGATIVE
Specific Gravity, UA: 1.008 (ref 1.005–1.030)
UUROB: 0.2 mg/dL (ref 0.0–1.9)
pH, UA: 6 (ref 5.0–7.5)

## 2014-07-24 LAB — ENA+DNA/DS+SJORGEN'S
ENA RNP AB: 2.7 AI — AB (ref 0.0–0.9)
ENA SSA (RO) Ab: <0.2 AI (ref 0.0–0.9)
ENA SSB (LA) Ab: <0.2 AI (ref 0.0–0.9)
dsDNA Ab: <1 IU/mL (ref 0–9)

## 2014-07-24 LAB — HEMOGLOBIN A1C
Est. average glucose Bld gHb Est-mCnc: 123 mg/dL
Hgb A1c MFr Bld: 5.9 % — ABNORMAL HIGH (ref 4.8–5.6)

## 2014-07-24 LAB — TSH: TSH: 1.56 u[IU]/mL (ref 0.450–4.500)

## 2014-07-24 LAB — CERULOPLASMIN: CERULOPLASMIN: 31.3 mg/dL (ref 16.0–45.0)

## 2014-07-24 LAB — COPPER, SERUM: Copper: 130 ug/dL (ref 72–166)

## 2014-07-24 LAB — ANA W/REFLEX: Anti Nuclear Antibody(ANA): POSITIVE — AB

## 2014-07-25 ENCOUNTER — Encounter: Payer: Self-pay | Admitting: Obstetrics & Gynecology

## 2014-07-25 ENCOUNTER — Ambulatory Visit (INDEPENDENT_AMBULATORY_CARE_PROVIDER_SITE_OTHER): Payer: BC Managed Care – PPO | Admitting: Obstetrics & Gynecology

## 2014-07-25 ENCOUNTER — Encounter: Payer: Self-pay | Admitting: Neurology

## 2014-07-25 ENCOUNTER — Ambulatory Visit (INDEPENDENT_AMBULATORY_CARE_PROVIDER_SITE_OTHER): Payer: BC Managed Care – PPO | Admitting: Neurology

## 2014-07-25 VITALS — BP 120/81 | HR 114 | Ht 65.5 in | Wt 219.0 lb

## 2014-07-25 VITALS — BP 122/81 | Temp 98.3°F | Ht 65.0 in | Wt 220.8 lb

## 2014-07-25 DIAGNOSIS — R531 Weakness: Secondary | ICD-10-CM

## 2014-07-25 DIAGNOSIS — N760 Acute vaginitis: Secondary | ICD-10-CM

## 2014-07-25 DIAGNOSIS — R5383 Other fatigue: Principal | ICD-10-CM

## 2014-07-25 DIAGNOSIS — B9689 Other specified bacterial agents as the cause of diseases classified elsewhere: Secondary | ICD-10-CM

## 2014-07-25 DIAGNOSIS — A499 Bacterial infection, unspecified: Secondary | ICD-10-CM

## 2014-07-25 DIAGNOSIS — R5381 Other malaise: Secondary | ICD-10-CM

## 2014-07-25 MED ORDER — MAGNESIUM OXIDE 400 MG PO TABS: 400.0000 mg | ORAL_TABLET | Freq: Every day | ORAL | Status: DC

## 2014-07-25 MED ORDER — CLINDAMYCIN HCL 300 MG PO CAPS: 300.0000 mg | ORAL_CAPSULE | Freq: Two times a day (BID) | ORAL | Status: DC

## 2014-07-25 NOTE — Progress Notes (Signed)
Depression Tool entered in error.

## 2014-07-25 NOTE — Progress Notes (Signed)
[id number]    Provider:  Dr Gina Golden Referring Provider: Vickii Penna., MD Primary Care Physician:  Gina Penna., MD  CC:  weakness  Requested to see patient for a 2nd opinion. Patient has long history of somatic symptoms, was worked up for [place] 10 years ago with unremarkable workup. In June developed new symptoms which prompted a neurological workup. Has had extensive workup including unremarkable brain MRI, normal EMG/NCS. She has had an extensive lab workup with only pertinent positives being positive ANA and positive RNP antibody (which can be positive in mixed connective tissue disorder). Since initial evaluation at [facility] in 06/2014 she has had a progression of her symptoms.   She notes excessive fatigue which has progressed to involve multiple somatic concerns as stated below in prior visits. She notes new symptoms since last visit with Dr Gina Golden on 9/14. She shows videos of her thigh twitching and her toe "quivering". Thigh twitching appears to be a higher amplitude shaking movement of the extremity. Toe movement appears to be flexion/extension movement. Main concern today is worsening short term memory.    Prior visit with Dr Gina Golden:  Gina Golden is a 38 y.o. female here as a referral from Dr. Tammi Golden for weakness. PMHx depression, PCOS, hypothyroidism, headaches, adult ADHD, endometriosis, lumpectomy and mastectomy,  She returns today for evaluation of worsening symptoms. She was on disability from work and returned due to financial strain but reports worsening difficulties and would Golden disability evaluation due progression of symptoms.   Patient was first seen in the office approx a month ago in August 2015 with the following symptoms:  fatigue and muscle weakness, muscles "quivering", severe weakness where her legs 'didn't hardly work", symptoms used to be worse at night but progressed where leg starts trembling within 15 minutes of getting up in the morning, also  with  blurred vision and unsteady balance, "swiss cheese" brain and word-finding difficulties, episodic slurred speech ,  feeling hot, hearing loss, skin sensitivity, headache,  tremor, sleepiness, decreased energy.  She reported the fatigue so severe that she was in the process of trying to get disability because she could not work.  Patient underwent testing for Myasthenia gravis including bloodwork and EMG/NCS which was negative. Neurologic exams in the office were normal without fatiguable ptosis or fatiguable extra occular movements. MRI of the brain was unremarkable. Patient was referred to [facility] for single-fiber EMG.    Today she returns to the office with progression of previous symptoms plus new neurologic complaints. She shows me a video of her great toe moving back and forth (flexion/extension) and reports this is a new symptom. She started having jumping in the muscles of the bilateral forearms. It "looked Golden I had Parkinson's". She could stop the muscle movements if she "thought about it real hard",  speech difficulty with pitch problems. She also couldn't see the computer screen well, she had to increase the font to see the letters but no diplopia. She described going home and sleeping for 18 hours,  She woke up and was "mopey all day".  She tried to go to work again but as soon as she woke up she had a problem with her voice, it kept "going up and down".  She feels Golden she is slurring right now as well. Last time she took hydrocodone was more than a month. She is not taking her thyroid medicine anymore because she is scared to take medication with her multitude of neurologic symptoms. She  is taking allergy medications. She also reports that after putting clobetasol on her arms for a rash her tongue feels numb. She has fingertip numbness in all the fingers. She initially denied drug use but, after I requested a urine drug screen, she stated that she smoked marijuana recently. Also reported  chronic abdominal pain.   Labwork revealed: HgbA1c 5.9, ANA positive (negative one month previous), positive ENA RNP Ab, EBV IgG positive, EBV VCA IgM WNL, EBV EA IgG, positive, EBV NA IgG WNL, CMV IgM WNL,   unrmarkable Labs: ceruloplasmin, urinary drug screen, TSH, copper, dsDNA, ENA Sm Ab, SSA/SSB, VGCC antibody, AchR Ab (binding, blocking, modulating), CBC, CMP, CK (33), Lyme IgG, IgM, B12(361), VitD 25-oh 44,   Pending; urinary porphyrins and porphorobilinogen, Anti-MuSK  MRi of the brain unremarkable.  EMG/NCS Conclusion: This is a normal study. No electrophysiologic evidence for polyneuropathy or myopathy. Repetitive Nerve Stimulation failed to reveal any significant decrement to support Myasthenia Gravis or increment to support Gina Golden Myasthenic Syndrome. Clinical correlation recommended.  Review of Systems: Patient complains of symptoms per HPI as well as the following symptoms activity change, appetite change, fatigue, excessive thrist, blurred vision, cough, ear discharge, SOB, frequent infections, headache, numbness, speech difficulty, weakness, tremors, walking difficulty, daytime sleepiness. Voice pitch control, slurring, extreme fatigue. Pertinent negatives per HPI. Otherwise out of a complete 14 system review, and all other reviewed systems are negative.   History   Social History  . Marital Status: Divorced    Spouse Name: N/A    Number of Children: 0  . Years of Education: 12+   Occupational History  .  Time Gina Golden   Social History Main Topics  . Smoking status: Current Every Day Smoker -- 0.50 packs/day    Types: Cigarettes    Start date: 11/08/2005  . Smokeless tobacco: Never Used  . Alcohol Use: 1.2 oz/week    2 Shots of liquor per week     Comment: social   . Drug Use: No  . Sexual Activity: Yes    Birth Control/ Protection: Condom, IUD   Other Topics Concern  . Not on file   Social History Narrative   Patient is single with no children.     Patient is right handed.   Patient has 2 Associates's degree.   Patient drinks 3-5 cups daily.    Family History  Problem Relation Age of Onset  . Cancer Father   . Heart disease Mother   . Heart disease Maternal Grandmother     Past Medical History  Diagnosis Date  . PCOS (polycystic ovarian syndrome)   . Hypothyroidism   . Depression   . Adult ADHD   . Allergy   . Endometriosis   . Psoriasis   . Migraine     Past Surgical History  Procedure Laterality Date  . Mastoidectomy  2007  . Breast lumpectomy  2010    right  . Mastectomy, partial  2010    left  . Adenoidectomy  1979  . Laprosc    . Inner ear surgery  1979, 2015    STATUS POST EAR SURGERY    Current Outpatient Prescriptions  Medication Sig Dispense Refill  . clobetasol ointment (TEMOVATE) 8.93 % Apply 1 application topically 2 (two) times daily as needed (rash, weekly).       Marland Kitchen levothyroxine (SYNTHROID, LEVOTHROID) 50 MCG tablet Take by mouth.      . loratadine (CLARITIN) 10 MG tablet Take 10 mg by mouth daily.      Marland Kitchen  mometasone (NASONEX) 50 MCG/ACT nasal spray Place 2 sprays into the nose daily.       . SUMAtriptan (IMITREX) 100 MG tablet Take 100 mg by mouth every 2 (two) hours as needed for migraine or headache. May repeat in 2 hours if headache persists or recurs.      . valACYclovir (VALTREX) 1000 MG tablet Take 1,000 mg by mouth daily as needed (outbreaks).        No current facility-administered medications for this visit.    Allergies as of 07/25/2014 - Review Complete 07/22/2014  Allergen Reaction Noted  . Effexor [venlafaxine hydrochloride] Other (See Comments) 02/10/2012  . Levaquin [levofloxacin in d5w] Shortness Of Breath, Itching, and Swelling 06/05/2014  . Sulfa drugs cross reactors Itching and Swelling 02/10/2012  . Tetracycline Shortness Of Breath 07/22/2014  . Tetracyclines & related Shortness Of Breath 02/10/2012  . Venlafaxine Anaphylaxis and Other (See Comments) 09/10/2013  .  Dilaudid [hydromorphone] Itching 06/19/2014  . Ibuprofen Other (See Comments) 06/19/2014  . Naproxen Other (See Comments) 06/19/2014  . Other Itching 07/22/2014  . Sulfa antibiotics Itching 07/22/2014  . Ceclor [cefaclor] Rash 02/10/2012    Vitals: There were no vitals taken for this visit. Last Weight:  Wt Readings from Last 1 Encounters:  07/22/14 219 lb (99.338 kg)   Last Height:   Ht Readings from Last 1 Encounters:  07/22/14 5' 5.5" (1.664 m)    Physical exam: Exam: Gen: NAD, mildly pressured speech. Eyes: anicteric sclerae, moist conjunctivae HENT: Atraumatic, oropharynx clear Neck: Trachea midline; supple,  Lungs: CTA, no wheezing, rales, rhonic                          CV: RRR, no MRG Abdomen: Soft, non-tender; Obese Extremities: No peripheral edema  Skin: Normal temperature, no rash,  Psych: Appropriate affect, pleasant  Neuro: Detailed Neurologic Exam  Speech:    Speech is normal; fluent and spontaneous with normal comprehension.  Cognition:    The patient is oriented to person, place, and time; memory intact; language fluent; normal attention, concentration, and fund of knowledge.  Cranial Nerves:    The pupils are equal, round, and reactive to light. The fundi are normal and spontaneous venous pulsations are present. Visual fields are full to finger confrontation. Extraocular movements are intact. Trigeminal sensation is intact and the muscles of mastication are normal. The face is symmetric. The palate elevates in the midline. Voice is normal. Shoulder shrug is normal. The tongue has normal motion without fasciculations. No fatiguable ptosis or EOM.   Coordination:    Normal finger to nose and heel to shin. Normal rapid alternating movements.   Gait:    Heel-toe and tandem gait are normal.   Motor Observation:    No asymmetry, no atrophy, and no involuntary movements noted. Tone:    Normal muscle tone.    Posture:    Posture is normal. normal erect     Strength:    Strength is V/V in the upper and lower limbs.       Vibratory Sensation:    Normal vibratory sensation in upper and lower extremities.   Light Touch:    Normal light touch sensation in upper and lower extremities.     Proprioception:    Normal proprioception in upper and lower extremities.  Pin Prick:    Normal sensation to pinprick in upper and lower extremities.    Temperature:    Normal temperature sensation in upper and lower extremities.  Reflex Exam:  DTR's:    Deep tendon reflexes in the upper and lower extremities are normal bilaterally.   Toes:    The toes are downgoing bilaterally.   Clonus:    Clonus is absent.   Assessment/Plan:   Patient is a 37 year old female PMHx depression, PCOS, hypothyroidism, headaches, adult ADHD, endometriosis, lumpectomy and mastectomy who is here for a multitude of symptoms. She has had an extensive workup which has been mostly unremarkable. She did have a + ANA and RNP which can be seen in [facility]. Unsure if these positive tests fully explain her symptoms but will refer to rheumatology for further workup. She is also scheduled to follow up with neurology at [facility] for further evaluation. Will start Magnesium 400mg  daily for frequent headaches. Counseled her that our office would fill out short term disability at least until she is evaluated by rheumatology.    Gina Like, DO  [facility] [address] [place], [place] [address]  Phone [phone] Fax [phone]

## 2014-07-30 ENCOUNTER — Telehealth: Payer: Self-pay | Admitting: *Deleted

## 2014-07-30 NOTE — Telephone Encounter (Signed)
Patient cancelled her new patient appointment she states she got into [facility] Dr Eduard Roux office notified

## 2014-07-30 NOTE — Telephone Encounter (Signed)
Dr. Posey Pronto - please disregard previous message sent about getting this patient onto your schedule, she has decided to go to [facility]. / Venida Jarvis

## 2014-07-30 NOTE — Telephone Encounter (Signed)
Noted  

## 2014-07-30 NOTE — Telephone Encounter (Signed)
11, fyi. Thanks

## 2014-07-31 NOTE — Progress Notes (Signed)
Patient ID: Gina Golden, female   DOB: September 29, 1976, 38 y.o.   MRN: [id number]  Chief Complaint  Patient presents with  . Problem    Vaginal discharge    HPI Gina Golden is a 38 y.o. female.   Vaginal Discharge The patient's primary symptoms include a vaginal discharge. This is a recurrent problem. The patient is experiencing no pain. The vaginal discharge was malodorous and milky. There has been no bleeding. She has tried nothing for the symptoms. Her past medical history is significant for vaginosis.    Past Medical History  Diagnosis Date  . PCOS (polycystic ovarian syndrome)   . Hypothyroidism   . Depression   . Adult ADHD   . Allergy   . Endometriosis   . Psoriasis   . Migraine     Past Surgical History  Procedure Laterality Date  . Mastoidectomy  2007  . Breast lumpectomy  2010    right  . Mastectomy, partial  2010    left  . Adenoidectomy  1979  . Laprosc    . Inner ear surgery  1979, 2015    STATUS POST EAR SURGERY    Family History  Problem Relation Age of Onset  . Cancer Father   . Heart disease Mother   . Heart disease Maternal Grandmother     Social History History  Substance Use Topics  . Smoking status: Current Every Day Smoker -- 0.50 packs/day    Types: Cigarettes    Start date: 11/08/2005  . Smokeless tobacco: Never Used  . Alcohol Use: No     Comment: social     Allergies  Allergen Reactions  . Effexor [Venlafaxine Hydrochloride] Other (See Comments)    seizures  . Levaquin [Levofloxacin In D5w] Shortness Of Breath, Itching and Swelling  . Sulfa Drugs Cross Reactors Itching and Swelling    Full body-head to toe swelling  . Tetracycline Shortness Of Breath  . Tetracyclines & Related Shortness Of Breath  . Venlafaxine Anaphylaxis and Other (See Comments)  . Dilaudid [Hydromorphone] Itching    Very intense itching  . Ibuprofen Other (See Comments)    increases acid reflux  . Naproxen Other (See Comments)    Increases acid reflux   . Other Itching  . Sulfa Antibiotics Itching  . Ceclor [Cefaclor] Rash    Current Outpatient Prescriptions  Medication Sig Dispense Refill  . clobetasol ointment (TEMOVATE) 7.93 % Apply 1 application topically. 2 to 3 times daily.      Marland Kitchen loratadine (CLARITIN) 10 MG tablet Take 10 mg by mouth daily.      . mometasone (NASONEX) 50 MCG/ACT nasal spray Place 2 sprays into the nose daily.       . clindamycin (CLEOCIN) 300 MG capsule Take 1 capsule (300 mg total) by mouth 2 (two) times daily.  14 capsule  0   No current facility-administered medications for this visit.    Review of Systems Review of Systems  Genitourinary: Positive for vaginal discharge.   Constitutional: negative for fatigue and weight loss Respiratory: negative for cough and wheezing Cardiovascular: negative for chest pain, fatigue and palpitations Gastrointestinal: negative for abdominal pain and change in bowel habits Integument/breast: negative for nipple discharge Musculoskeletal:negative for myalgias Neurological: negative for gait problems and tremors Behavioral/Psych: negative for abusive relationship, depression Endocrine: negative for temperature intolerance     Blood pressure 122/81, temperature 98.3 F (36.8 C), height 5\' 5"  (1.651 m), weight 100.154 kg (220 lb 12.8 oz).  Physical Exam Physical  Exam General:   alert  Skin:   no rash or abnormalities  Lungs:   clear to auscultation bilaterally  Heart:   regular rate and rhythm, S1, S2 normal, no murmur, click, rub or gallop  Abdomen:  normal findings: no organomegaly, soft, non-tender and no hernia  Pelvis:  External genitalia: normal general appearance Urinary system: urethral meatus normal and bladder without fullness, nontender Vaginal: thin, white discharge Cervix: normal appearance Adnexa: normal bimanual exam Uterus: anteverted and non-tender, normal size     Data Reviewed None  Assessment    Recurrent BV    Plan     Meds  ordered this encounter  Medications  . clindamycin (CLEOCIN) 300 MG capsule    Sig: Take 1 capsule (300 mg total) by mouth 2 (two) times daily.    Dispense:  14 capsule    Refill:  0    Possible management options include: Boric acid/suppressive Rx Follow up as needed.         JACKSON-MOORE,Kollyn Lingafelter A 07/31/2014, 11:40 AM

## 2014-08-01 ENCOUNTER — Telehealth: Payer: Self-pay | Admitting: Neurology

## 2014-08-01 ENCOUNTER — Ambulatory Visit: Payer: BC Managed Care – PPO | Admitting: Neurology

## 2014-08-01 ENCOUNTER — Other Ambulatory Visit: Payer: Self-pay | Admitting: Neurology

## 2014-08-01 NOTE — Telephone Encounter (Signed)
Patient calling to check on status of rheumatology referral, please return call and advise.

## 2014-08-01 NOTE — Telephone Encounter (Signed)
Called and lt VM message that the referral had been sent to Dr Arlean Hopping officeon 07/24/14 and that their office will contact to schedule appt. Refaxed the referral to Dr Arlean Hopping office 08/01/14 and called them to confirm

## 2014-08-05 ENCOUNTER — Other Ambulatory Visit: Payer: Self-pay | Admitting: Family Medicine

## 2014-08-05 DIAGNOSIS — N63 Unspecified lump in unspecified breast: Secondary | ICD-10-CM

## 2014-08-06 ENCOUNTER — Telehealth: Payer: Self-pay | Admitting: Neurology

## 2014-08-06 NOTE — Telephone Encounter (Signed)
Amy from [employer] calling to get patient's work status, wants to know if patient is being kept out of work and for how long, please return call and advise.

## 2014-08-06 NOTE — Telephone Encounter (Signed)
Gina Golden is filling out the paperwork for Gina Golden. I'm no longer seeing her as a patient. Thank you!

## 2014-08-07 NOTE — Telephone Encounter (Signed)
Please let her know we are sending in short term disability paperwork for her. I am keeping her out of work until she follows up with rheumatology for further evaluation. If her rheumatology workup is unremarkable then she is cleared to return to work from our standpoint.

## 2014-08-08 ENCOUNTER — Ambulatory Visit
Admission: RE | Admit: 2014-08-08 | Discharge: 2014-08-08 | Disposition: A | Discharge status: Home / Self Care | Payer: BC Managed Care – PPO | Source: Ambulatory Visit | Attending: Family Medicine | Admitting: Family Medicine

## 2014-08-08 ENCOUNTER — Other Ambulatory Visit: Payer: Self-pay | Admitting: Family Medicine

## 2014-08-08 DIAGNOSIS — N63 Unspecified lump in unspecified breast: Secondary | ICD-10-CM

## 2014-08-12 ENCOUNTER — Telehealth: Payer: Self-pay | Admitting: *Deleted

## 2014-08-12 NOTE — Telephone Encounter (Addendum)
I received Forms from Medical Records and I have placed them in the Providers file and have placed them on his assistants In-Box to be completed.  Forms are due by 08/16/14.

## 2014-08-12 NOTE — Telephone Encounter (Signed)
I told her we would do short term disability until she was evaluated by rheumatology. If rheumatology cleared her then she was cleared to return to work from our standpoint. I already filled out paperwork and asked a medical assistant to fax it. I am not clear what came of these forms.

## 2014-08-12 NOTE — Telephone Encounter (Signed)
Appointment information given to Gina Golden for [place]. Patient is to be contacted with information.

## 2014-08-12 NOTE — Telephone Encounter (Signed)
Verdia Kuba was the physician for this patient. I am no longer involved in her care. Since he is still in the [facility] health system I will ask him to follow up. I did not agree to fill out these paperwork for patient.

## 2014-08-12 NOTE — Telephone Encounter (Addendum)
New patient appointment has been scheduled for 08/21/14 @ 9:15 with Dr. Estanislado Pandy ,this is the first available appointment. Patient has been contacted multiple times by Dr. Arlean Hopping office to schedule appointment. They have left several messages for the patient to call and schedule. Dr. Arlean Hopping office # [phone] [address] [place].

## 2014-08-12 NOTE — Telephone Encounter (Signed)
What has been decided?

## 2014-08-14 HISTORY — PX: INNER EAR SURGERY: SHX679

## 2014-08-14 NOTE — Telephone Encounter (Signed)
I called the patient to let her know that I was able to fax her forms yesterday and I have the confirmation.  I am also mailing her a copy of the forms.  Sent to MR dept

## 2014-08-30 ENCOUNTER — Ambulatory Visit (INDEPENDENT_AMBULATORY_CARE_PROVIDER_SITE_OTHER): Payer: BC Managed Care – PPO | Admitting: Obstetrics & Gynecology

## 2014-08-30 ENCOUNTER — Encounter: Payer: Self-pay | Admitting: Obstetrics & Gynecology

## 2014-08-30 VITALS — BP 128/87 | HR 109 | Temp 98.4°F | Ht 65.0 in | Wt 224.0 lb

## 2014-08-30 DIAGNOSIS — R21 Rash and other nonspecific skin eruption: Secondary | ICD-10-CM

## 2014-08-30 DIAGNOSIS — B977 Papillomavirus as the cause of diseases classified elsewhere: Secondary | ICD-10-CM

## 2014-08-30 DIAGNOSIS — R946 Abnormal results of thyroid function studies: Secondary | ICD-10-CM

## 2014-08-30 DIAGNOSIS — IMO0002 Reserved for concepts with insufficient information to code with codable children: Secondary | ICD-10-CM

## 2014-08-30 DIAGNOSIS — R888 Abnormal findings in other body fluids and substances: Secondary | ICD-10-CM

## 2014-08-31 NOTE — Progress Notes (Signed)
Patient ID: FIZZA SCALES, female   DOB: Jun 01, 1976, 38 y.o.   MRN: [id number]  Chief Complaint  Patient presents with  . Advice Only    Birth Control     HPI AUNE ADAMI is a 38 y.o. female.  She has multiple somatic complaints without a diagnosis.  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  HPI  Past Medical History  Diagnosis Date  . PCOS (polycystic ovarian syndrome)   . Hypothyroidism   . Depression   . Adult ADHD   . Allergy   . Endometriosis   . Psoriasis   . Migraine     Past Surgical History  Procedure Laterality Date  . Mastoidectomy  2007  . Breast lumpectomy  2010    right  . Mastectomy, partial  2010    left  . Adenoidectomy  1979  . Laprosc    . Inner ear surgery  1979, 2015    STATUS POST EAR SURGERY  . Inner ear surgery  08/14/14    Family History  Problem Relation Age of Onset  . Cancer Father   . Heart disease Mother   . Heart disease Maternal Grandmother     Social History History  Substance Use Topics  . Smoking status: Current Every Day Smoker -- 0.50 packs/day    Types: Cigarettes    Start date: 11/08/2005  . Smokeless tobacco: Never Used  . Alcohol Use: 1.2 oz/week    2 Shots of liquor per week     Comment: social     Allergies  Allergen Reactions  . Effexor [Venlafaxine Hydrochloride] Other (See Comments)    seizures  . Levaquin [Levofloxacin In D5w] Shortness Of Breath, Itching and Swelling  . Sulfa Drugs Cross Reactors Itching and Swelling    Full body-head to toe swelling  . Tetracycline Shortness Of Breath  . Tetracyclines & Related Shortness Of Breath  . Venlafaxine Anaphylaxis and Other (See Comments)  . Dilaudid [Hydromorphone] Itching    Very intense itching  . Ibuprofen Other (See Comments)    increases acid reflux  . Naproxen Other (See Comments)    Increases acid reflux  . Other Itching  . Sulfa Antibiotics Itching  . Ceclor [Cefaclor] Rash    Current Outpatient Prescriptions  Medication Sig Dispense  Refill  . clobetasol ointment (TEMOVATE) 2.77 % Apply 1 application topically. 2 to 3 times daily.      Marland Kitchen loratadine (CLARITIN) 10 MG tablet Take 10 mg by mouth daily.      . mometasone (NASONEX) 50 MCG/ACT nasal spray Place 2 sprays into the nose daily.       . SUMAtriptan (IMITREX) 100 MG tablet       . valACYclovir (VALTREX) 1000 MG tablet        No current facility-administered medications for this visit.    Review of Systems Review of Systems Constitutional: negative for fatigue and weight loss Respiratory: negative for cough and wheezing Cardiovascular: negative for chest pain, fatigue and palpitations Gastrointestinal: negative for abdominal pain and change in bowel habits Genitourinary: positive for pelvic pain Integument/breast: positive for a rash Musculoskeletal:negative for myalgias Neurological: negative for gait problems and tremors Behavioral/Psych: negative for abusive relationship, depression Endocrine: negative for temperature intolerance     Blood pressure 128/87, pulse 109, temperature 98.4 F (36.9 C), height 5\' 5"  (1.651 m), weight 101.606 kg (224 lb).  Physical Exam Physical Exam General:   alert  Skin:   diffuse, erythematous papules  Lungs:   clear  to auscultation bilaterally  Heart:   regular rate and rhythm, S1, S2 normal, no murmur, click, rub or gallop  Abdomen:  normal findings: no organomegaly, soft, non-tender and no hernia  Pelvis:  External genitalia: normal general appearance Urinary system: urethral meatus normal and bladder without fullness, nontender Vaginal: normal without tenderness, induration or masses Cervix: normal appearance Adnexa: normal bimanual exam Uterus: anteverted and non-tender, normal size   Pelvic U/S w/3-D imaging: IUD in an appropriate position; endometrium normal   Data Reviewed None  Assessment    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 Ambulatory referral to Endocrinology    Referral Priority:  Routine    Referral Type:  Consultation    Referral Reason:  Specialty Services Required    Number of Visits Requested:  1  . Ambulatory referral to Dermatology    Referral Priority:  Routine    Referral Type:  Consultation    Referral Reason:  Specialty Services Required    Requested Specialty:  Dermatology    Number of Visits Requested:  1    Follow up as needed.         JACKSON-MOORE,Carr Shartzer A 08/31/2014, 7:34 PM

## 2014-08-31 NOTE — Patient Instructions (Signed)

## 2014-09-03 ENCOUNTER — Encounter: Payer: Self-pay | Admitting: Internal Medicine

## 2014-09-03 ENCOUNTER — Ambulatory Visit (INDEPENDENT_AMBULATORY_CARE_PROVIDER_SITE_OTHER): Payer: BC Managed Care – PPO | Admitting: Internal Medicine

## 2014-09-03 VITALS — BP 122/80 | HR 106 | Temp 98.8°F | Resp 12 | Ht 65.0 in | Wt 226.0 lb

## 2014-09-03 DIAGNOSIS — E039 Hypothyroidism, unspecified: Secondary | ICD-10-CM | POA: Insufficient documentation

## 2014-09-03 LAB — PAP IG AND HPV HIGH-RISK: HPV DNA High Risk: NOT DETECTED

## 2014-09-03 NOTE — Patient Instructions (Signed)
Please come back for thyroid labs in January. We will schedule another appointment if the labs are abnormal. Hang in there!

## 2014-09-03 NOTE — Progress Notes (Signed)
Patient ID: Gina Golden, female   DOB: [birth year], 38 y.o.   MRN: [id number]   HPI  Gina Golden is a 38 y.o.-year-old female, referred by her ObGyn Dr., Dr. Laurance Flatten, for management of hypothyroidism.  Patient tells me that in the last 4 months, she developed several unusual symptoms, including severe fatigue, weakness, muscle spasms, memory problems, headaches, sometimes slurred speech.  She was seen in the emergency room and a stroke was ruled out. She subsequently saw 3 neurologists and a rheumatologist. She had a presumptive dx of myasthenia gravis >> stopped many of her meds during workup for this in fear that these may interact with the MG. Among these medicines, she also stopped levothyroxine. The diagnosis of myasthenia gravis was ruled out. She was recently diagnosed with fibromyalgia by rheumatology. She tells me she will enroll in a study at [facility] for undiagnosed diseases.  She was dx with hypothyroidism in ~2005. She was started on levothyroxine, which she took for the last approximately 10 years. She stopped LT4 mid August >> thyroid tests normal in 07/2014 and the repeat was normal in 08/2014 (see below).   I reviewed pt's thyroid tests: 08/21/2014: TSH 0.579  Lab Results  Component Value Date   TSH 1.560 07/22/2014   TSH 1.122 05/30/2014    She was on Levothyroxine 50 mcg, taken: - at bedtime! - with food! - no calcium, iron, PPIs, multivitamins   Pt describes: - no exaggerated weight gain - + fatigue, + extreme weakness - in the last 4 months >> difficulty walking - + mm spasms - + memory loss, + cannot access words - sometimes speech slurred, sometimes chewing problems - sometimes balance problems - + HA (migraines with aura) - no cold intolerance, + heat intolerance, + sweating - has bipolar disorder; + depression/+ anxiety - no constipation - dry skin - hair falling - + coughing, + losing her voice - has another appt with ENT  Pt denies feeling nodules in neck,  hoarseness, dysphagia/odynophagia, SOB with lying down.  She has no FH of thyroid disorders. No FH of thyroid cancer.  No h/o radiation tx to head or neck. No recent use of iodine supplements.  I reviewed her chart and she also has a history of PCOS.  ROS: Constitutional: See history of present illness Eyes: + blurry vision, no xerophthalmia ENT: no sore throat, no nodules palpated in throat, no dysphagia/odynophagia, + hoarseness Cardiovascular: no CP/SOB/+ palpitations/no leg swelling Respiratory: + cough/+ SOB Gastrointestinal: no N/V/D/C/+ acid reflux Musculoskeletal: + all: muscle/joint aches Skin: + rash -face, + itching, hair loss, excessive hair growth  Neurological:+ tremors/no numbness/tingling/dizziness, +headache  Psychiatric: + both: depression/anxiety  Past Medical History  Diagnosis Date  . PCOS (polycystic ovarian syndrome)   . Hypothyroidism   . Depression   . Adult ADHD   . Allergy   . Endometriosis   . Psoriasis   . Migraine    Past Surgical History  Procedure Laterality Date  . Mastoidectomy  2007  . Breast lumpectomy  2010    right  . Mastectomy, partial  2010    left  . Adenoidectomy  1979  . Laprosc    . Inner ear surgery  1979, 2015    STATUS POST EAR SURGERY  . Inner ear surgery  08/14/14   History   Social History  . Marital Status: Divorced    Spouse Name: N/A    Number of Children: 0  . Years of Education: 12+   Occupational  History  .  Time [name]   Social History Main Topics  . Smoking status: Current Every Day Smoker -- 0.50 packs/day    Types: Cigarettes    Start date: 11/08/2005  . Smokeless tobacco: Never Used  . Alcohol Use: 1.2 oz/week    2 Shots of liquor per week     Comment: social   . Drug Use: No  . Sexual Activity: Yes    Birth Control/ Protection: Condom, IUD     Comment: Do Not Review OB History.    Social History Narrative   Patient is single with no children.   Patient is right handed.   Patient  has 2 Associates's degree.   Patient drinks 3-5 cups daily.   Current Outpatient Prescriptions on File Prior to Visit  Medication Sig Dispense Refill  . clobetasol ointment (TEMOVATE) 2.70 % Apply 1 application topically. 2 to 3 times daily.      Marland Kitchen loratadine (CLARITIN) 10 MG tablet Take 10 mg by mouth daily.      . mometasone (NASONEX) 50 MCG/ACT nasal spray Place 2 sprays into the nose daily.       . SUMAtriptan (IMITREX) 100 MG tablet       . valACYclovir (VALTREX) 1000 MG tablet        No current facility-administered medications on file prior to visit.   Allergies  Allergen Reactions  . Effexor [Venlafaxine Hydrochloride] Other (See Comments)    seizures  . Levaquin [Levofloxacin In D5w] Shortness Of Breath, Itching and Swelling  . Sulfa Drugs Cross Reactors Itching and Swelling    Full body-head to toe swelling  . Tetracycline Shortness Of Breath  . Tetracyclines & Related Shortness Of Breath  . Venlafaxine Anaphylaxis and Other (See Comments)  . Dilaudid [Hydromorphone] Itching    Very intense itching  . Ibuprofen Other (See Comments)    increases acid reflux  . Naproxen Other (See Comments)    Increases acid reflux  . Other Itching  . Sulfa Antibiotics Itching  . Ceclor [Cefaclor] Rash   Family History  Problem Relation Age of Onset  . Cancer Father   . Heart disease Mother   . Heart disease Maternal Grandmother    PE: BP 122/80  Pulse 106  Temp(Src) 98.8 F (37.1 C) (Oral)  Resp 12  Ht 5\' 5"  (1.651 m)  Wt 226 lb (102.513 kg)  BMI 37.61 kg/m2  SpO2 95% Wt Readings from Last 3 Encounters:  09/03/14 226 lb (102.513 kg)  08/30/14 224 lb (101.606 kg)  07/25/14 220 lb 12.8 oz (100.154 kg)   Constitutional: overweight, in NAD Eyes: PERRLA, EOMI, no exophthalmos ENT: moist mucous membranes, no thyromegaly, no cervical lymphadenopathy Cardiovascular: tachycardia, RR, No MRG Respiratory: CTA B Gastrointestinal: abdomen soft, NT, ND, BS+ Musculoskeletal: no  deformities, strength intact in all 4 Skin: moist, warm, + rash on face, acne Neurological: no tremor with outstretched hands, DTR normal in all 4  ASSESSMENT: 1. Hypothyroidism  PLAN:  1. Patient with long-standing hypothyroidism, previously on levothyroxine therapy, now off the medicine, with persistently normal thyroid tests. She has multiple symptoms but no clear symptoms of hypothyroidism. She does not appear to have a goiter, thyroid nodules, or neck compression symptoms - the patient was taking a low dose of levothyroxine, 50 g daily, and she was taking it with food, at night, so likely she was not absorbing the dose  - now, off the medicine, we have 2 TSH levels that are normal, confirming that she  is not hypothyroid anymore. - I discussed with the patient to remain off the levothyroxine, but to check TSH, free T4, free T3 in 3 months to ensure stability  - If these are abnormal, she will need to return in 6-8 weeks for repeat labs - we will schedule another appointment if the above labs are abnormal  - She agrees with the plan.

## 2014-09-09 ENCOUNTER — Encounter: Payer: Self-pay | Admitting: Internal Medicine

## 2014-09-10 ENCOUNTER — Telehealth: Payer: Self-pay

## 2014-09-10 NOTE — Telephone Encounter (Signed)
PATIENT CALLED REGARDING EMAIL SHE SENT FOR [facility] - WILL RESEND EMAIL IN CASE YOU DID NOT GET - WOULD LIKE CALL BACK

## 2014-09-15 NOTE — Telephone Encounter (Signed)
Please call pt.  Will send letter this week

## 2014-09-18 ENCOUNTER — Encounter: Payer: Self-pay | Admitting: *Deleted

## 2014-09-18 NOTE — Telephone Encounter (Signed)
Letter composed and patient is aware.

## 2014-09-24 ENCOUNTER — Ambulatory Visit: Payer: 59 | Admitting: Neurology

## 2014-09-30 ENCOUNTER — Telehealth: Payer: Self-pay | Admitting: Neurology

## 2014-09-30 NOTE — Telephone Encounter (Signed)
Spoke with Dr. 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 The results were unremarkable and nothing was found on MRI to explain her symptoms. 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 This is an asymptomatic and incidental finding sometimes seen in normal individuals.

## 2014-09-30 NOTE — Telephone Encounter (Signed)
Dr. Christin Fudge Su @ [phone] requesting a return call.

## 2014-10-28 ENCOUNTER — Telehealth: Payer: Self-pay | Admitting: *Deleted

## 2014-10-28 ENCOUNTER — Other Ambulatory Visit: Payer: Self-pay | Admitting: *Deleted

## 2014-10-28 DIAGNOSIS — N760 Acute vaginitis: Principal | ICD-10-CM

## 2014-10-28 DIAGNOSIS — B9689 Other specified bacterial agents as the cause of diseases classified elsewhere: Secondary | ICD-10-CM

## 2014-10-28 MED ORDER — METRONIDAZOLE 500 MG PO TABS: 500.0000 mg | ORAL_TABLET | Freq: Two times a day (BID) | ORAL | Status: DC

## 2014-10-28 NOTE — Telephone Encounter (Signed)
Patient called stating she had a BV infection and would like an Rx sent to [facility] on [address] in [place].   CB: Patient states she is having vaginal odor and that Dr. Delsa Sale has treated her for these in the past. Per Nursing protocol Metronidazole sent to Pharmacy.

## 2014-11-04 ENCOUNTER — Encounter: Payer: Self-pay | Admitting: *Deleted

## 2014-11-05 ENCOUNTER — Encounter: Payer: Self-pay | Admitting: Obstetrics & Gynecology

## 2014-12-04 ENCOUNTER — Ambulatory Visit: Payer: BC Managed Care – PPO | Admitting: Internal Medicine

## 2015-01-03 ENCOUNTER — Other Ambulatory Visit: Payer: Self-pay | Admitting: Family Medicine

## 2015-01-03 DIAGNOSIS — D241 Benign neoplasm of right breast: Secondary | ICD-10-CM

## 2015-02-11 ENCOUNTER — Ambulatory Visit
Admission: RE | Admit: 2015-02-11 | Discharge: 2015-02-11 | Disposition: A | Discharge status: Home / Self Care | Payer: BLUE CROSS/BLUE SHIELD | Source: Ambulatory Visit | Attending: Family Medicine | Admitting: Family Medicine

## 2015-02-11 DIAGNOSIS — D241 Benign neoplasm of right breast: Secondary | ICD-10-CM

## 2015-02-11 IMAGING — MG MM DIGITAL DIAGNOSTIC BILAT CAD
6 series · 6 of 6 positions shown · non-contrast
Comparison: 09/05/2012 and 02/10/2012 mammograms.

CLINICAL DATA: 38-year-old female for annual bilateral mammograms
and with palpable retroareolar right breast mass.

EXAM:
DIGITAL DIAGNOSTIC  BILATERAL MAMMOGRAM WITH CAD
ULTRASOUND RIGHT BREAST

[R CC]
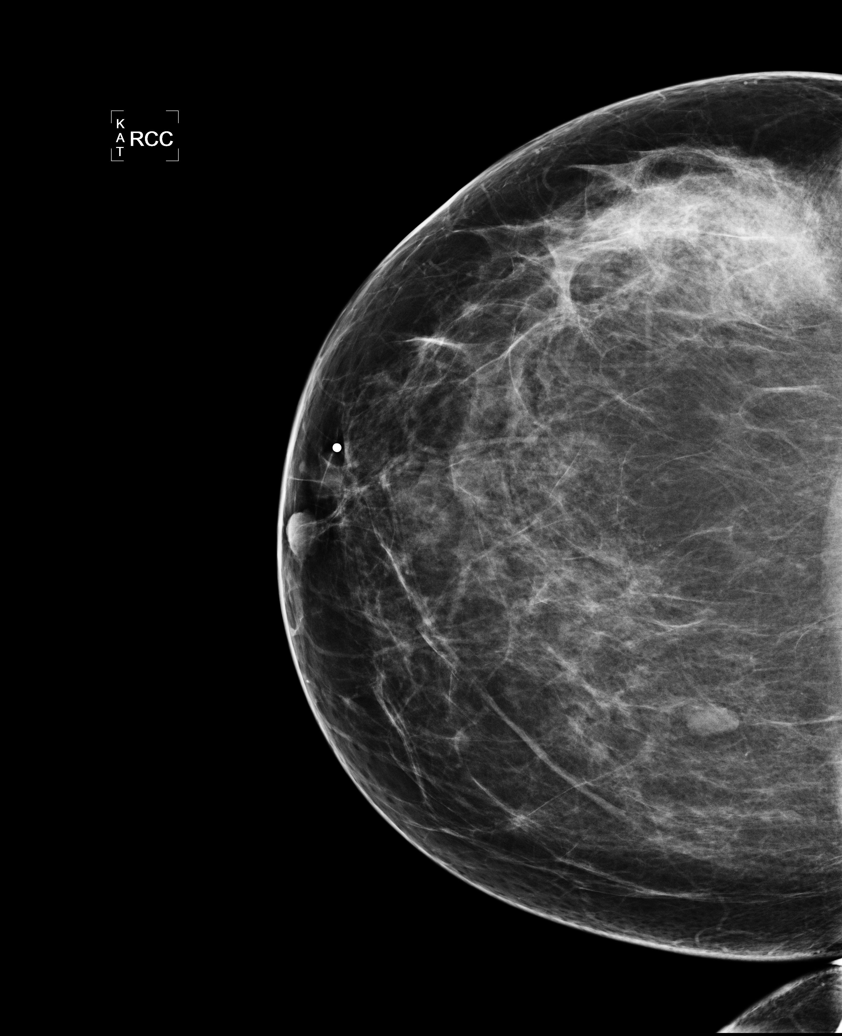

[L CC]
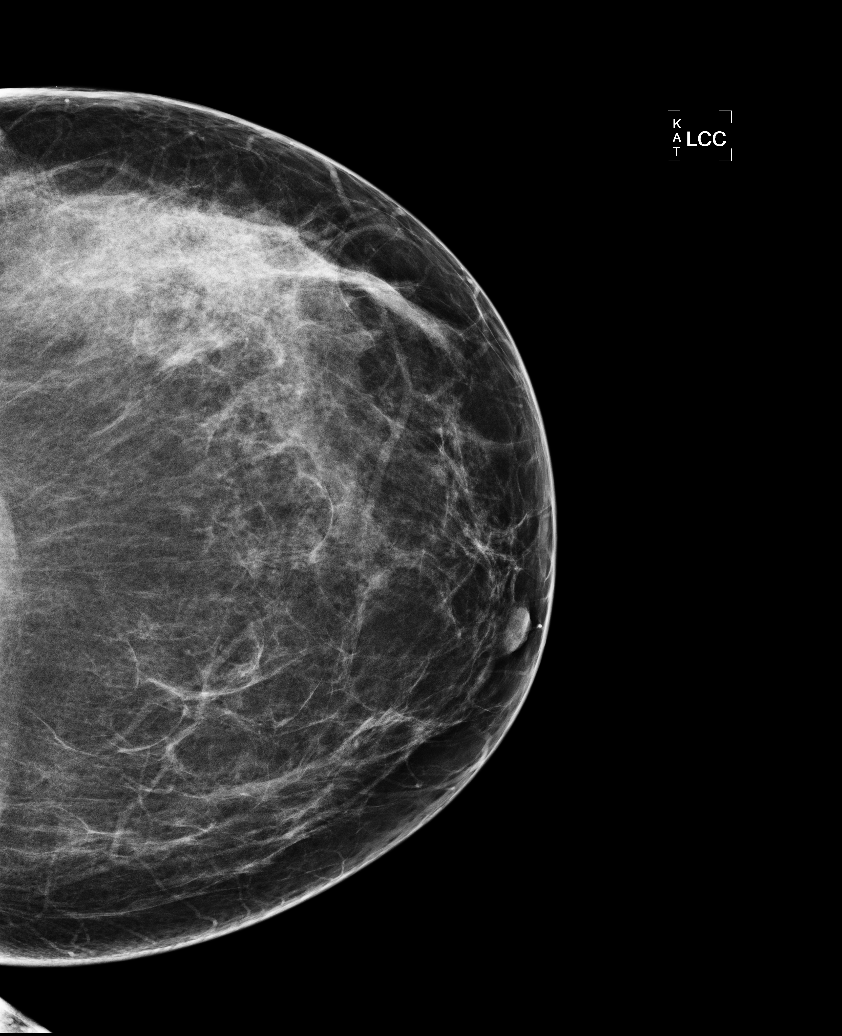

[L MLO]
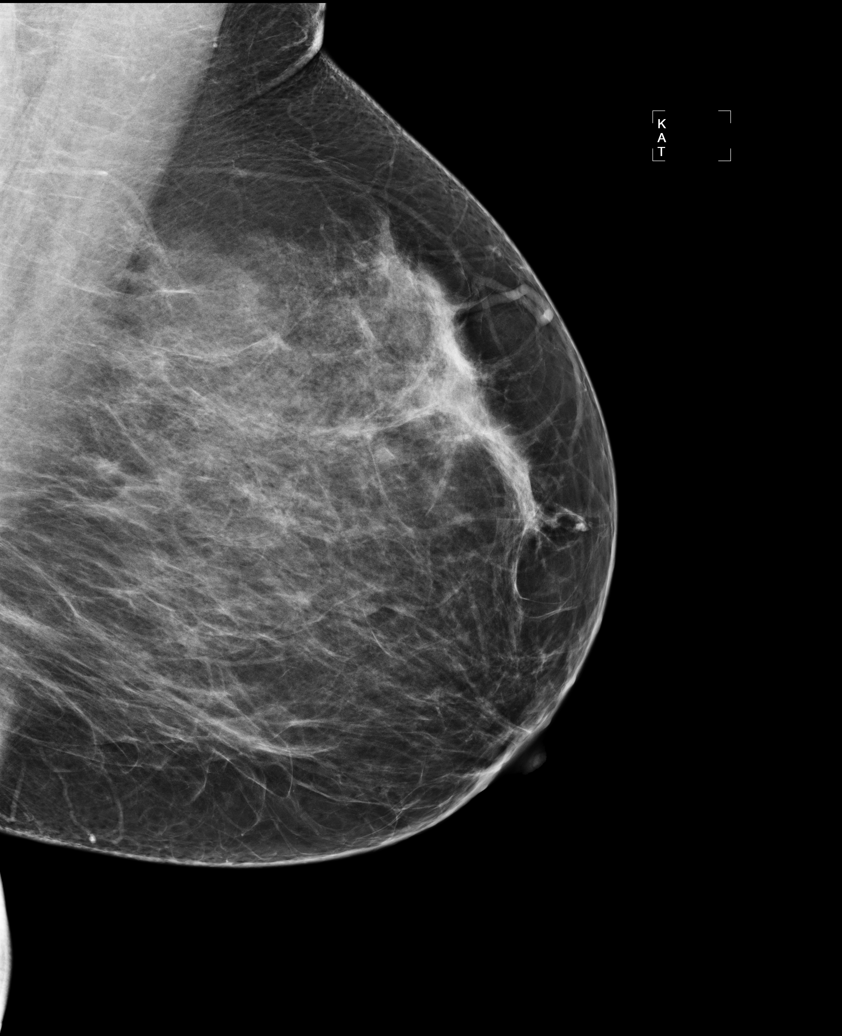

[R MLO]
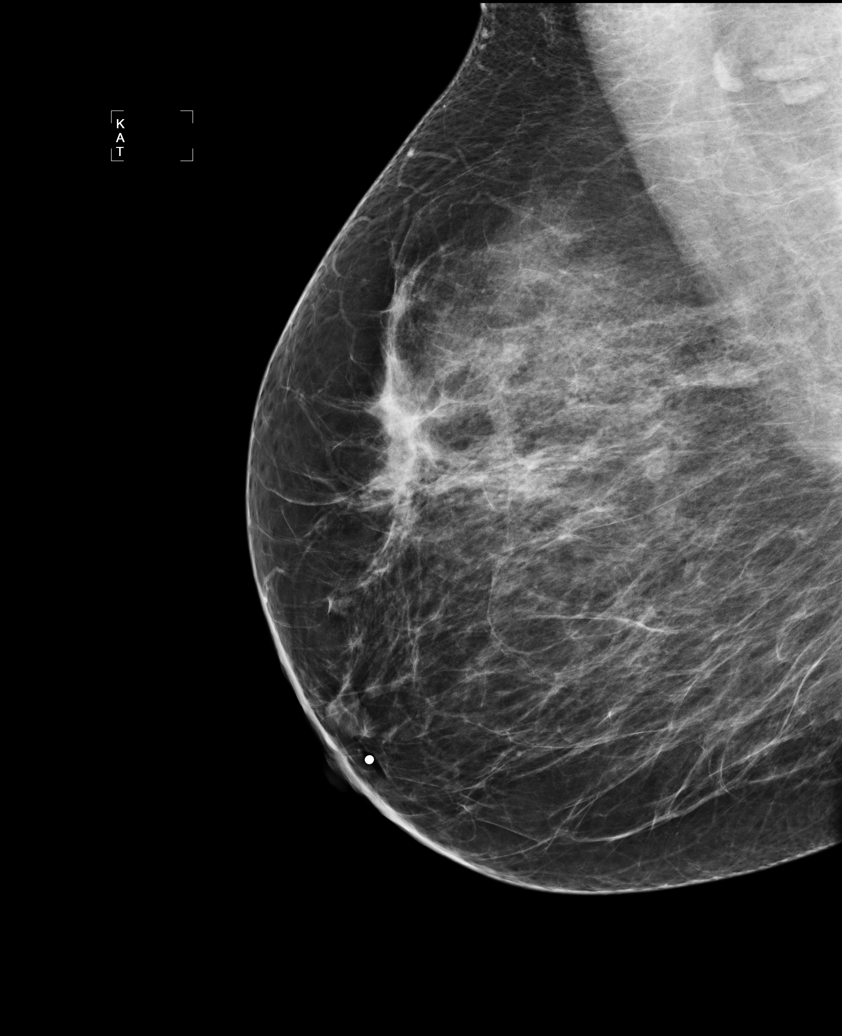

[R TAN (1 of 2)]
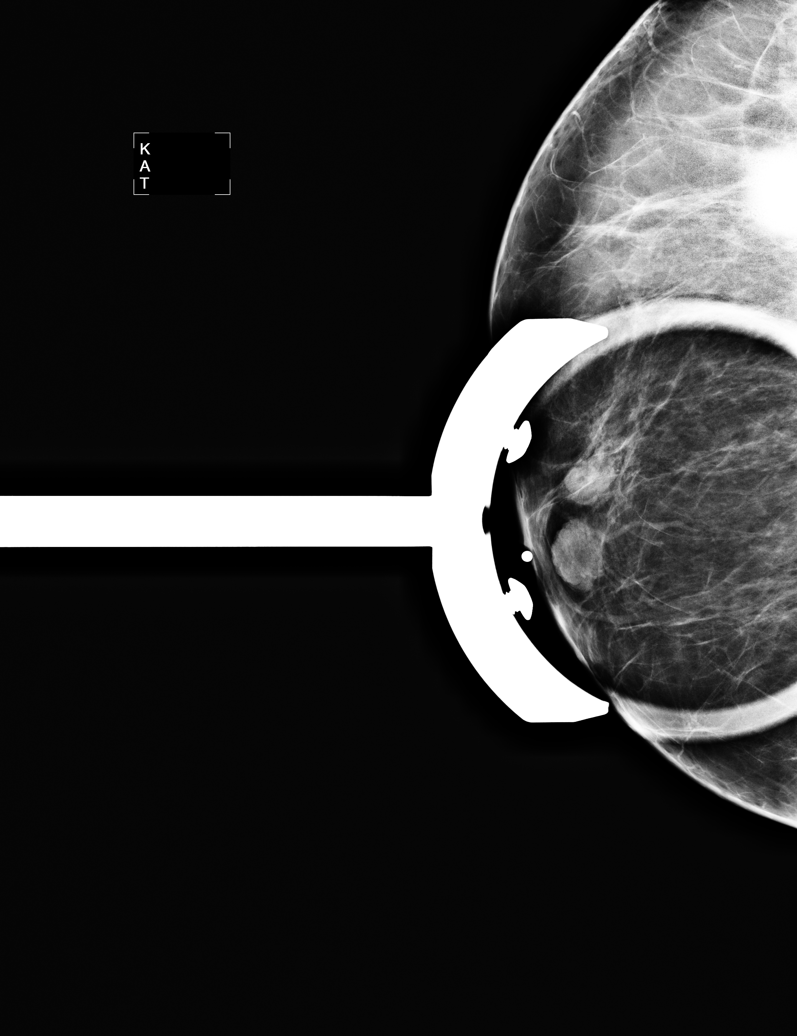

[R TAN (2 of 2)]
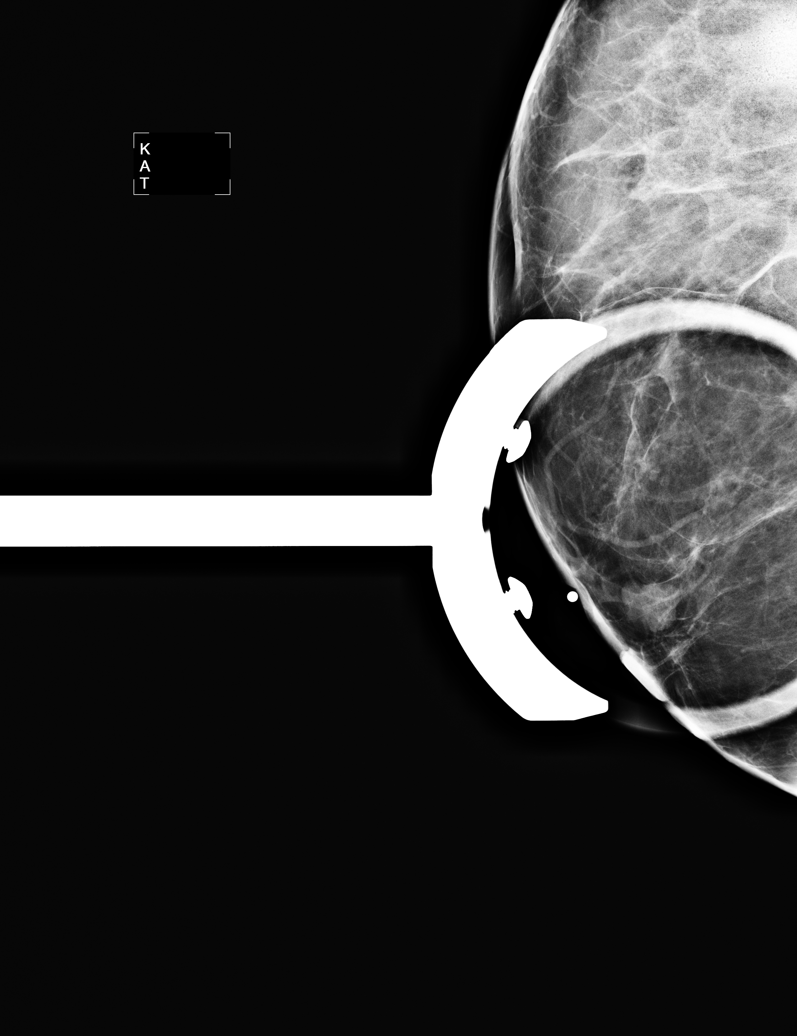

[6 of 6 positions shown; findings below may reference images not displayed]

02/10/2012
ultrasound.

ACR Breast Density Category c: The breast tissue is heterogeneously
dense, which may obscure small masses.
FINDINGS: Spot compression views of the right breast and routine views of both
breasts demonstrate a circumscribed oval mass in the retroareolar
right breast.

No other suspicious mass, distortion or worrisome calcifications are
identified bilaterally.

Mammographic images were processed with CAD.

On physical exam, a firm palpable mobile mass is identified at the 9
o'clock position of the retroareolar right breast.

Ultrasound is performed, showing a 1.2 x 0.6 x 1.1 cm circumscribed
oval hypoechoic parallel mass at the 9 o'clock position of the
retroareolar right breast, likely representing a fibroadenoma.

A stable 5 x 9 mm circumscribed oval hypoechoic mass at the 1
o'clock position of the right breast 12 cm from the nipple is
unchanged from 0906, compatible with a fibroadenoma.
IMPRESSION: 1.2 x 0.6 x 1.1 cm probable fibroadenoma in the outer retroareolar
right breast. We discussed management options including excision,
ultrasound-guided core biopsy, and short term interval follow-up.
Follow-up ultrasound is recommended at 6, 12,and 24 months to assess
stability. The patient concurs with this plan.

No suspicious mammographic findings bilaterally.

RECOMMENDATION:
Right breast ultrasound in 6 months.

I have discussed the findings and recommendations with the patient.
Results were also provided in writing at the conclusion of the
visit. If applicable, a reminder letter will be sent to the patient
regarding the next appointment.

BI-RADS CATEGORY  3: Probably benign.

## 2015-04-12 ENCOUNTER — Encounter (HOSPITAL_COMMUNITY): Payer: Self-pay

## 2015-04-12 ENCOUNTER — Emergency Department (HOSPITAL_COMMUNITY)
Admission: EM | Admit: 2015-04-12 | Discharge: 2015-04-12 | Disposition: A | Discharge status: Home / Self Care | Payer: BLUE CROSS/BLUE SHIELD | Attending: Emergency Medicine | Admitting: Emergency Medicine

## 2015-04-12 ENCOUNTER — Emergency Department (HOSPITAL_COMMUNITY)
Admission: EM | Admit: 2015-04-12 | Discharge: 2015-04-12 | Discharge status: Against Medical Advice | Payer: BLUE CROSS/BLUE SHIELD | Attending: Emergency Medicine | Admitting: Emergency Medicine

## 2015-04-12 DIAGNOSIS — G970 Cerebrospinal fluid leak from spinal puncture: Secondary | ICD-10-CM | POA: Insufficient documentation

## 2015-04-12 DIAGNOSIS — L299 Pruritus, unspecified: Secondary | ICD-10-CM | POA: Diagnosis not present

## 2015-04-12 DIAGNOSIS — Z79899 Other long term (current) drug therapy: Secondary | ICD-10-CM | POA: Diagnosis not present

## 2015-04-12 DIAGNOSIS — Y9389 Activity, other specified: Secondary | ICD-10-CM | POA: Diagnosis not present

## 2015-04-12 DIAGNOSIS — Z7951 Long term (current) use of inhaled steroids: Secondary | ICD-10-CM | POA: Diagnosis not present

## 2015-04-12 DIAGNOSIS — Z87891 Personal history of nicotine dependence: Secondary | ICD-10-CM | POA: Insufficient documentation

## 2015-04-12 DIAGNOSIS — Y848 Other medical procedures as the cause of abnormal reaction of the patient, or of later complication, without mention of misadventure at the time of the procedure: Secondary | ICD-10-CM | POA: Insufficient documentation

## 2015-04-12 DIAGNOSIS — Z7952 Long term (current) use of systemic steroids: Secondary | ICD-10-CM | POA: Insufficient documentation

## 2015-04-12 DIAGNOSIS — T368X5A Adverse effect of other systemic antibiotics, initial encounter: Secondary | ICD-10-CM | POA: Diagnosis not present

## 2015-04-12 DIAGNOSIS — Z8619 Personal history of other infectious and parasitic diseases: Secondary | ICD-10-CM | POA: Insufficient documentation

## 2015-04-12 DIAGNOSIS — T361X5A Adverse effect of cephalosporins and other beta-lactam antibiotics, initial encounter: Secondary | ICD-10-CM | POA: Diagnosis not present

## 2015-04-12 DIAGNOSIS — X58XXXA Exposure to other specified factors, initial encounter: Secondary | ICD-10-CM | POA: Diagnosis not present

## 2015-04-12 DIAGNOSIS — G9600 Cerebrospinal fluid leak, unspecified: Secondary | ICD-10-CM

## 2015-04-12 DIAGNOSIS — Z872 Personal history of diseases of the skin and subcutaneous tissue: Secondary | ICD-10-CM | POA: Insufficient documentation

## 2015-04-12 DIAGNOSIS — G43909 Migraine, unspecified, not intractable, without status migrainosus: Secondary | ICD-10-CM | POA: Insufficient documentation

## 2015-04-12 DIAGNOSIS — Z4801 Encounter for change or removal of surgical wound dressing: Secondary | ICD-10-CM | POA: Diagnosis present

## 2015-04-12 DIAGNOSIS — Y998 Other external cause status: Secondary | ICD-10-CM | POA: Insufficient documentation

## 2015-04-12 DIAGNOSIS — K219 Gastro-esophageal reflux disease without esophagitis: Secondary | ICD-10-CM | POA: Diagnosis not present

## 2015-04-12 DIAGNOSIS — Z8639 Personal history of other endocrine, nutritional and metabolic disease: Secondary | ICD-10-CM | POA: Diagnosis not present

## 2015-04-12 DIAGNOSIS — Z8742 Personal history of other diseases of the female genital tract: Secondary | ICD-10-CM | POA: Insufficient documentation

## 2015-04-12 DIAGNOSIS — F329 Major depressive disorder, single episode, unspecified: Secondary | ICD-10-CM | POA: Diagnosis not present

## 2015-04-12 DIAGNOSIS — Y9289 Other specified places as the place of occurrence of the external cause: Secondary | ICD-10-CM | POA: Insufficient documentation

## 2015-04-12 HISTORY — DX: Gastro-esophageal reflux disease without esophagitis: K21.9

## 2015-04-12 HISTORY — DX: Compression of brain: G93.5

## 2015-04-12 HISTORY — DX: Tuberculosis of spine: A18.01

## 2015-04-12 LAB — BASIC METABOLIC PANEL
Anion gap: 11 (ref 5–15)
BUN: 14 mg/dL (ref 6–20)
CALCIUM: 9.2 mg/dL (ref 8.9–10.3)
CO2: 20 mmol/L — AB (ref 22–32)
Chloride: 106 mmol/L (ref 101–111)
Creatinine, Ser: 0.69 mg/dL (ref 0.44–1.00)
GFR calc Af Amer: >60 mL/min (ref >60)
GFR calc non Af Amer: >60 mL/min (ref >60)
Glucose, Bld: 88 mg/dL (ref 65–99)
Potassium: 3.9 mmol/L (ref 3.5–5.1)
Sodium: 137 mmol/L (ref 135–145)

## 2015-04-12 LAB — SEDIMENTATION RATE: Sed Rate: 10 mm/hr (ref 0–22)

## 2015-04-12 LAB — CBC WITH DIFFERENTIAL/PLATELET
BASOS PCT: 1 % (ref 0–1)
Basophils Absolute: 0.2 10*3/uL — ABNORMAL HIGH (ref 0.0–0.1)
EOS ABS: 0.6 10*3/uL (ref 0.0–0.7)
Eosinophils Relative: 3 % (ref 0–5)
HEMATOCRIT: 42.1 % (ref 36.0–46.0)
Hemoglobin: 14.2 g/dL (ref 12.0–15.0)
LYMPHS ABS: 8.2 10*3/uL — AB (ref 0.7–4.0)
Lymphocytes Relative: 40 % (ref 12–46)
MCH: 29.2 pg (ref 26.0–34.0)
MCHC: 33.7 g/dL (ref 30.0–36.0)
MCV: 86.6 fL (ref 78.0–100.0)
MONOS PCT: 8 % (ref 3–12)
Monocytes Absolute: 1.6 10*3/uL — ABNORMAL HIGH (ref 0.1–1.0)
NEUTROS PCT: 48 % (ref 43–77)
Neutro Abs: 9.8 10*3/uL — ABNORMAL HIGH (ref 1.7–7.7)
Platelets: 314 10*3/uL (ref 150–400)
RBC: 4.86 MIL/uL (ref 3.87–5.11)
RDW: 13.8 % (ref 11.5–15.5)
WBC: 20.4 10*3/uL — ABNORMAL HIGH (ref 4.0–10.5)

## 2015-04-12 LAB — PROTIME-INR
INR: 0.94 (ref 0.00–1.49)
Prothrombin Time: 12.8 seconds (ref 11.6–15.2)

## 2015-04-12 LAB — C-REACTIVE PROTEIN

## 2015-04-12 MED ORDER — CLINDAMYCIN HCL 150 MG PO CAPS: 450.0000 mg | ORAL_CAPSULE | Freq: Four times a day (QID) | ORAL | Status: AC

## 2015-04-12 MED ORDER — CEPHALEXIN 250 MG PO CAPS
500.0000 mg | ORAL_CAPSULE | Freq: Once | ORAL | Status: AC
  Administered 2015-04-12: 500 mg via ORAL
  Filled 2015-04-12: qty 2

## 2015-04-12 MED ORDER — CEPHALEXIN 500 MG PO CAPS: 500.0000 mg | ORAL_CAPSULE | Freq: Two times a day (BID) | ORAL | Status: AC

## 2015-04-12 NOTE — ED Notes (Signed)
The pt does not think she needs to stay any longer.  She feel better.  She will return if she has further problems.  Pt cautioned to call ems if she develops diffiuclty breathing

## 2015-04-12 NOTE — Discharge Instructions (Signed)
Wound Infection °A wound infection happens when a type of germ (bacteria) starts growing in the wound. In some cases, this can cause the wound to break open. If cared for properly, the infected wound will heal from the inside to the outside. Wound infections need treatment. °CAUSES °An infection is caused by bacteria growing in the wound.  °SYMPTOMS  °· Increase in redness, swelling, or pain at the wound site. °· Increase in drainage at the wound site. °· Wound or bandage (dressing) starts to smell bad. °· Fever. °· Feeling tired or fatigued. °· Pus draining from the wound. °TREATMENT  °Your health care provider will prescribe antibiotic medicine. The wound infection should improve within 24 to 48 hours. Any redness around the wound should stop spreading and the wound should be less painful.  °HOME CARE INSTRUCTIONS  °· Only take over-the-counter or prescription medicines for pain, discomfort, or fever as directed by your health care provider. °· Take your antibiotics as directed. Finish them even if you start to feel better. °· Gently wash the area with mild soap and water 2 times a day, or as directed. Rinse off the soap. Pat the area dry with a clean towel. Do not rub the wound. This may cause bleeding. °· Follow your health care provider's instructions for how often you need to change the dressing. °· Apply ointment and a dressing to the wound as directed. °· If the dressing sticks, moisten it with soapy water and gently remove it. °· Change the bandage right away if it becomes wet, dirty, or develops a bad smell. °· Take showers. Do not take tub baths, swim, or do anything that may soak the wound until it is healed. °· Avoid exercises that make you sweat heavily. °· Use anti-itch medicine as directed by your health care provider. The wound may itch when it is healing. Do not pick or scratch at the wound. °· Follow up with your health care provider to get your wound rechecked as directed. °SEEK MEDICAL CARE  IF: °· You have an increase in swelling, pain, or redness around the wound. °· You have an increase in the amount of pus coming from the wound. °· There is a bad smell coming from the wound. °· More of the wound breaks open. °· You have a fever. °MAKE SURE YOU:  °· Understand these instructions. °· Will watch your condition. °· Will get help right away if you are not doing well or get worse. °Document Released: 07/24/2003 Document Revised: 10/30/2013 Document Reviewed: 02/28/2011 °ExitCare® Patient Information ©2015 [employer]. This information is not intended to replace advice given to you by your health care provider. Make sure you discuss any questions you have with your health care provider. ° °

## 2015-04-12 NOTE — ED Provider Notes (Signed)
CSN: [id number]     Arrival date & time 04/12/15  1412 History   None    Chief Complaint  Patient presents with  . Wound Check     (Consider location/radiation/quality/duration/timing/severity/associated sxs/prior Treatment) Patient is a 39 y.o. female presenting with wound check.  Wound Check This is a new problem. The current episode started 1 to 4 weeks ago (Patient had a occipital craniectomy, tonsil excision, duraplasty, C1 laminectomy, and C2 undercut performed on May 24.). The problem occurs constantly. The problem has been unchanged. Pertinent negatives include no abdominal pain, anorexia, arthralgias, change in bowel habit, chest pain, chills, congestion, coughing, diaphoresis, fatigue, fever, headaches, joint swelling, myalgias, nausea, neck pain, numbness, rash, sore throat, swollen glands, urinary symptoms, vertigo, visual change, vomiting or weakness. Nothing aggravates the symptoms. She has tried nothing for the symptoms. The treatment provided no relief.    Past Medical History  Diagnosis Date  . PCOS (polycystic ovarian syndrome)   . Hypothyroidism   . Depression   . Adult ADHD   . Allergy   . Endometriosis   . Psoriasis   . Migraine   . Pott's disease   . Chiari I malformation   . GERD (gastroesophageal reflux disease)    Past Surgical History  Procedure Laterality Date  . Mastoidectomy  2007  . Breast lumpectomy  2010    right  . Mastectomy, partial  2010    left  . Adenoidectomy  1979  . Laprosc    . Inner ear surgery  1979, 2015    STATUS POST EAR SURGERY  . Inner ear surgery  08/14/14  . Craniectomy      S/p suboccipital craniectomy, C1 laminectomy and duraplasty, subpial resection of cerebellar tonsils bilateral, C2 undercutting laminoplasty for Chiari on 04-01-15 @ [facility] ([place]) in [place].   Family History  Problem Relation Age of Onset  . Cancer Father   . Heart disease Mother   . Heart disease Maternal Grandmother     History  Substance Use Topics  . Smoking status: Former Smoker -- 0.00 packs/day    Start date: 11/08/2005    Quit date: 02/10/2015  . Smokeless tobacco: Never Used  . Alcohol Use: No   OB History    Gravida Para Term Preterm AB TAB SAB Ectopic Multiple Living   1    1  1         Review of Systems  Constitutional: Negative for fever, chills, diaphoresis, appetite change and fatigue.  HENT: Negative for congestion, ear pain, facial swelling, mouth sores and sore throat.   Eyes: Negative for visual disturbance.  Respiratory: Negative for cough, chest tightness and shortness of breath.   Cardiovascular: Negative for chest pain and palpitations.  Gastrointestinal: Negative for nausea, vomiting, abdominal pain, diarrhea, blood in stool, anorexia and change in bowel habit.  Endocrine: Negative for cold intolerance and heat intolerance.  Genitourinary: Negative for frequency, decreased urine volume and difficulty urinating.  Musculoskeletal: Negative for myalgias, back pain, joint swelling, arthralgias, neck pain and neck stiffness.  Skin: Positive for wound. Negative for rash.  Neurological: Negative for dizziness, vertigo, weakness, light-headedness, numbness and headaches.  All other systems reviewed and are negative.     Allergies  Effexor; Levaquin; Sulfa drugs cross reactors; Tetracycline; Tetracyclines & related; Venlafaxine; Dilaudid; Ibuprofen; Naproxen; Other; Sulfa antibiotics; and Ceclor  Home Medications   Prior to Admission medications   Medication Sig Start Date End Date Taking? Authorizing Provider  betamethasone (CELESTONE) 0.6 MG/5ML solution  Take 1.2 mg by mouth.    Historical Provider, MD  clobetasol ointment (TEMOVATE) 9.47 % Apply 1 application topically. 2 to 3 times daily.    Historical Provider, MD  loratadine (CLARITIN) 10 MG tablet Take 10 mg by mouth daily.    Historical Provider, MD  magnesium oxide (MAG-OX) 400 MG tablet Take 400 mg by mouth.     Historical Provider, MD  metFORMIN (GLUCOPHAGE) 500 MG tablet  08/07/14   Historical Provider, MD  metroNIDAZOLE (FLAGYL) 500 MG tablet Take 1 tablet (500 mg total) by mouth 2 (two) times daily. 10/28/14   Lahoma Crocker, MD  mometasone (NASONEX) 50 MCG/ACT nasal spray Place 2 sprays into the nose daily.     Historical Provider, MD  SUMAtriptan (IMITREX) 100 MG tablet Take 100 mg by mouth as needed.  07/24/14   Historical Provider, MD  topiramate (TOPAMAX) 100 MG tablet  08/07/14   Historical Provider, MD  valACYclovir (VALTREX) 1000 MG tablet Take 1,000 mg by mouth as needed.  05/28/14   Historical Provider, MD   BP 156/89 mmHg  Pulse 91  Temp(Src) 98.2 F (36.8 C) (Oral)  Resp 18  Ht 5\' 5"  (1.651 m)  Wt 230 lb 12.8 oz (104.69 kg)  BMI 38.41 kg/m2  SpO2 96% Physical Exam  Constitutional: She is oriented to person, place, and time. She appears well-developed and well-nourished. No distress.  HENT:  Head: Normocephalic and atraumatic.  Right Ear: External ear normal.  Left Ear: External ear normal.  Nose: Nose normal.  Eyes: Conjunctivae and EOM are normal. Pupils are equal, round, and reactive to light. Right eye exhibits no discharge. Left eye exhibits no discharge. No scleral icterus.  Neck: Normal range of motion. Neck supple.    Incision mildly dehisced with clear drainage concerning for CSF. Very minimal hyperemia surrounding the wound. No overt erythema or tenderness to palpation. No underlying fluctuance.  Cardiovascular: Normal rate, regular rhythm and normal heart sounds.  Exam reveals no gallop and no friction rub.   No murmur heard. Pulmonary/Chest: Effort normal and breath sounds normal. No stridor. No respiratory distress. She has no wheezes.  Abdominal: Soft. She exhibits no distension. There is no tenderness.  Musculoskeletal: She exhibits no edema or tenderness.  Neurological: She is alert and oriented to person, place, and time. She has normal strength. No sensory  deficit. Coordination normal. GCS eye subscore is 4. GCS verbal subscore is 5. GCS motor subscore is 6.  Skin: Skin is warm and dry. No rash noted. She is not diaphoretic. No erythema.  Psychiatric: She has a normal mood and affect.    ED Course  Procedures (including critical care time) Labs Review Labs Reviewed  WOUND CULTURE  CBC WITH DIFFERENTIAL/PLATELET  BASIC METABOLIC PANEL  PROTIME-INR  SEDIMENTATION RATE  C-REACTIVE PROTEIN    Imaging Review No results found.   EKG Interpretation None      MDM   Final diagnoses:  None    39 year old female with a history of Chiari malformation status post occipital craniectomy, Subluxation, Duraplasty, C1 Laminectomy, and C2 Undercut that was performed on May, 24 by Dr. Charlyne Mom in [place]. She presents today for wound check. Patient reports that she started noticing drainage from the wound several days ago and contacted Dr. Charlyne Mom who recommended evaluation in the ED as he was unavailable given his recent surgery. Patient denies any recent fevers, nuchal pain, headache, dizziness, nausea, vomiting. She denies any focal deficits. Wrists of the history and exam as above. Presentation  concerning for CSF leak from the wound. The wound itself looks clean however it is mildly dehisced but there is no evidence of overt infection. There is a concern for communication into the dural space given the CSF leak. Dr. Charlyne Mom was contacted who recommended culturing the wound then cleaning and dressing it. He also recommended starting the patient on Bactrim. However but the patient is allergic to sulfa medications and will be placed on Keflex. Patient is allergic to cefaclor however she states that she has had Keflex in the past and has done fine with it. We will obtain basic screening labs with the patient does not require any imaging at this time.  Labs remarkable for leukocytosis at 20; however, the patient just finished a tapered dose of  steroid-induced. With the no reported fevers nausea vomiting or headache this concerned for meningitis at this time. Inflammatory markers within normal limits.  Patient will be discharged with prescription of Keflex and clindamycin.  Plan was communicated to the patient and she was agreeable. Dr. Charlyne Mom will be receiving the patient's on Monday. The patient was instructed to contact his office Monday morning for specific time of visit.   Patient seen in conjunction with Dr. Colin Rhein.   Sibyl Parr, MD Resident   Addison Lank, MD 04/12/15 1754  Debby Freiberg, MD 04/13/15 1556

## 2015-04-12 NOTE — ED Notes (Signed)
Pt left ED around 6pm.  Dx CSF leak post op.  Pt got first dose of keflex  @ 4pm, pt left ED went to get prescriptions filled, took another dose of Keflex @ 8pm and first dose of Clindamycin @ 8pm.  20 minutes after taking pt feels like she can't get good deep breath and chest is itching.  NO rash seen.  Pt able to talk in complete sentences, no respiratory distress.  Pt reports just feeling bad at this time.  Sensitive to light and noise.

## 2015-04-12 NOTE — ED Notes (Signed)
MD at bedside. 

## 2015-04-12 NOTE — ED Notes (Addendum)
S/p suboccipital craniectomy, C1 laminectomy and duraplasty, subpial resection of cerebellar tonsils bilateral, C2 undercutting laminoplasty for Chiari on 04-01-15 @ [facility] ([place]) in [place].  Onset several days pt has noticed wound on neck oozing, worsened today, yellow drainage, slight redness noted to wound.  Neurosurgeon and PCP advised to ED for wound culture, antibiotics and dressing applied.

## 2015-04-12 NOTE — ED Notes (Addendum)
Pt tolerated well. Wound was covered with Bacitracin and covered per direction of MD.

## 2015-04-15 ENCOUNTER — Emergency Department (HOSPITAL_COMMUNITY)
Admission: EM | Admit: 2015-04-15 | Discharge: 2015-04-16 | Disposition: A | Discharge status: Home / Self Care | Payer: BLUE CROSS/BLUE SHIELD | Attending: Emergency Medicine | Admitting: Emergency Medicine

## 2015-04-15 ENCOUNTER — Encounter (HOSPITAL_COMMUNITY): Payer: Self-pay | Admitting: *Deleted

## 2015-04-15 DIAGNOSIS — T148XXA Other injury of unspecified body region, initial encounter: Secondary | ICD-10-CM

## 2015-04-15 DIAGNOSIS — T8189XA Other complications of procedures, not elsewhere classified, initial encounter: Secondary | ICD-10-CM | POA: Insufficient documentation

## 2015-04-15 DIAGNOSIS — Z872 Personal history of diseases of the skin and subcutaneous tissue: Secondary | ICD-10-CM | POA: Insufficient documentation

## 2015-04-15 DIAGNOSIS — Z79899 Other long term (current) drug therapy: Secondary | ICD-10-CM | POA: Diagnosis not present

## 2015-04-15 DIAGNOSIS — Y838 Other surgical procedures as the cause of abnormal reaction of the patient, or of later complication, without mention of misadventure at the time of the procedure: Secondary | ICD-10-CM | POA: Insufficient documentation

## 2015-04-15 DIAGNOSIS — Z8719 Personal history of other diseases of the digestive system: Secondary | ICD-10-CM | POA: Diagnosis not present

## 2015-04-15 DIAGNOSIS — G43909 Migraine, unspecified, not intractable, without status migrainosus: Secondary | ICD-10-CM | POA: Diagnosis not present

## 2015-04-15 DIAGNOSIS — F329 Major depressive disorder, single episode, unspecified: Secondary | ICD-10-CM | POA: Diagnosis not present

## 2015-04-15 DIAGNOSIS — L24A9 Irritant contact dermatitis due friction or contact with other specified body fluids: Secondary | ICD-10-CM

## 2015-04-15 DIAGNOSIS — Z8742 Personal history of other diseases of the female genital tract: Secondary | ICD-10-CM | POA: Insufficient documentation

## 2015-04-15 DIAGNOSIS — Z87891 Personal history of nicotine dependence: Secondary | ICD-10-CM | POA: Insufficient documentation

## 2015-04-15 DIAGNOSIS — T8131XA Disruption of external operation (surgical) wound, not elsewhere classified, initial encounter: Secondary | ICD-10-CM | POA: Insufficient documentation

## 2015-04-15 DIAGNOSIS — Z8619 Personal history of other infectious and parasitic diseases: Secondary | ICD-10-CM | POA: Insufficient documentation

## 2015-04-15 DIAGNOSIS — Z8639 Personal history of other endocrine, nutritional and metabolic disease: Secondary | ICD-10-CM | POA: Diagnosis not present

## 2015-04-15 LAB — CBC WITH DIFFERENTIAL/PLATELET
Basophils Absolute: 0.1 10*3/uL (ref 0.0–0.1)
Basophils Relative: 0 % (ref 0–1)
Eosinophils Absolute: 0.4 10*3/uL (ref 0.0–0.7)
Eosinophils Relative: 3 % (ref 0–5)
HCT: 41.1 % (ref 36.0–46.0)
Hemoglobin: 14 g/dL (ref 12.0–15.0)
LYMPHS PCT: 33 % (ref 12–46)
Lymphs Abs: 5.2 10*3/uL — ABNORMAL HIGH (ref 0.7–4.0)
MCH: 28.6 pg (ref 26.0–34.0)
MCHC: 34.1 g/dL (ref 30.0–36.0)
MCV: 84 fL (ref 78.0–100.0)
MONO ABS: 1 10*3/uL (ref 0.1–1.0)
MONOS PCT: 6 % (ref 3–12)
Neutro Abs: 9.5 10*3/uL — ABNORMAL HIGH (ref 1.7–7.7)
Neutrophils Relative %: 58 % (ref 43–77)
PLATELETS: 346 10*3/uL (ref 150–400)
RBC: 4.89 MIL/uL (ref 3.87–5.11)
RDW: 13.5 % (ref 11.5–15.5)
WBC: 16.1 10*3/uL — AB (ref 4.0–10.5)

## 2015-04-15 LAB — COMPREHENSIVE METABOLIC PANEL
ALBUMIN: 3.7 g/dL (ref 3.5–5.0)
ALT: 28 U/L (ref 14–54)
AST: 20 U/L (ref 15–41)
Alkaline Phosphatase: 75 U/L (ref 38–126)
Anion gap: 9 (ref 5–15)
BUN: 13 mg/dL (ref 6–20)
CALCIUM: 8.9 mg/dL (ref 8.9–10.3)
CO2: 21 mmol/L — ABNORMAL LOW (ref 22–32)
Chloride: 105 mmol/L (ref 101–111)
Creatinine, Ser: 0.71 mg/dL (ref 0.44–1.00)
GFR calc non Af Amer: >60 mL/min (ref >60)
GLUCOSE: 112 mg/dL — AB (ref 65–99)
Potassium: 4.3 mmol/L (ref 3.5–5.1)
SODIUM: 135 mmol/L (ref 135–145)
Total Bilirubin: 0.7 mg/dL (ref 0.3–1.2)
Total Protein: 6.8 g/dL (ref 6.5–8.1)

## 2015-04-15 LAB — WOUND CULTURE
Culture: NO GROWTH
Gram Stain: NONE SEEN

## 2015-04-15 MED ORDER — FUROSEMIDE 20 MG PO TABS: 20.0000 mg | ORAL_TABLET | Freq: Every day | ORAL | Status: AC

## 2015-04-15 MED ORDER — ACETAZOLAMIDE 125 MG PO TABS: 250.0000 mg | ORAL_TABLET | Freq: Four times a day (QID) | ORAL | Status: AC

## 2015-04-15 MED ORDER — LIDOCAINE HCL 2 % IJ SOLN
20.0000 mL | Freq: Once | INTRAMUSCULAR | Status: AC
  Administered 2015-04-15: 400 mg via INTRADERMAL
  Filled 2015-04-15: qty 20

## 2015-04-15 NOTE — ED Provider Notes (Signed)
CSN: [id number]     Arrival date & time 04/15/15  1916 History   First MD Initiated Contact with Patient 04/15/15 2015     Chief Complaint  Patient presents with  . Post-op Problem  . Drainage from Incision     (Consider location/radiation/quality/duration/timing/severity/associated sxs/prior Treatment) Patient is a 39 y.o. female presenting with general illness. The history is provided by the patient.  Illness Location:  Posterior neck Quality:  Wound drainage Severity:  Mild Onset quality:  Sudden Duration:  2 hours Timing:  Intermittent Progression:  Waxing and waning Chronicity:  Recurrent Context:  Recent Chiari malformation surgery 2 weeks ago. Seen 3 days ago for same. ER spoke to her Neurosurgeon who felt it was likely some mild drainage from the wound and recommended antibiotics and close watching Relieved by:  Nothing Worsened by:  Nothing Associated symptoms: headaches   Associated symptoms: no chest pain, no fever and no shortness of breath     Past Medical History  Diagnosis Date  . PCOS (polycystic ovarian syndrome)   . Hypothyroidism   . Depression   . Adult ADHD   . Allergy   . Endometriosis   . Psoriasis   . Migraine   . Pott's disease   . Chiari I malformation   . GERD (gastroesophageal reflux disease)    Past Surgical History  Procedure Laterality Date  . Mastoidectomy  2007  . Breast lumpectomy  2010    right  . Mastectomy, partial  2010    left  . Adenoidectomy  1979  . Laprosc    . Inner ear surgery  1979, 2015    STATUS POST EAR SURGERY  . Inner ear surgery  08/14/14  . Craniectomy      S/p suboccipital craniectomy, C1 laminectomy and duraplasty, subpial resection of cerebellar tonsils bilateral, C2 undercutting laminoplasty for Chiari on 04-01-15 @ [facility] ([place] in [place].   Family History  Problem Relation Age of Onset  . Cancer Father   . Heart disease Mother   . Heart disease Maternal Grandmother     History  Substance Use Topics  . Smoking status: Former Smoker -- 0.00 packs/day    Start date: 11/08/2005    Quit date: 02/10/2015  . Smokeless tobacco: Never Used  . Alcohol Use: No   OB History    Gravida Para Term Preterm AB TAB SAB Ectopic Multiple Living   1    1  1         Review of Systems  Constitutional: Negative for fever.  Respiratory: Negative for shortness of breath.   Cardiovascular: Negative for chest pain.  Neurological: Positive for headaches.  All other systems reviewed and are negative.     Allergies  Effexor; Levaquin; Sulfa drugs cross reactors; Tetracyclines & related; Topamax; Venlafaxine; Dilaudid; Ibuprofen; Naproxen; Prednisone; Ceclor; and Sulfa antibiotics  Home Medications   Prior to Admission medications   Medication Sig Start Date End Date Taking? Authorizing Provider  cephALEXin (KEFLEX) 500 MG capsule Take 1 capsule (500 mg total) by mouth 2 (two) times daily. Patient taking differently: Take 500 mg by mouth 2 (two) times daily. Started on 04-12-15 04/12/15 04/19/15 Yes Addison Lank, MD  clindamycin (CLEOCIN) 150 MG capsule Take 3 capsules (450 mg total) by mouth 4 (four) times daily. Patient taking differently: Take 450 mg by mouth 4 (four) times daily. Started medication  on 04-12-15 04/12/15 04/19/15 Yes Addison Lank, MD  metFORMIN (GLUCOPHAGE-XR) 500 MG 24 hr tablet Take 500 mg  by mouth 2 (two) times daily.   Yes Historical Provider, MD  OXcarbazepine (TRILEPTAL) 150 MG tablet Take 300 mg by mouth 2 (two) times daily.   Yes Historical Provider, MD  oxyCODONE-acetaminophen (PERCOCET/ROXICET) 5-325 MG per tablet Take 2 tablets by mouth every 4 (four) hours as needed for severe pain.   Yes Historical Provider, MD  spironolactone (ALDACTONE) 100 MG tablet Take 100 mg by mouth daily.   Yes Historical Provider, MD  SUMAtriptan (IMITREX) 100 MG tablet Take 100 mg by mouth as needed for migraine.  07/24/14  Yes Historical Provider, MD  valACYclovir  (VALTREX) 1000 MG tablet Take 1,000 mg by mouth as needed (for fever blisters).  05/28/14  Yes Historical Provider, MD  acetaminophen (TYLENOL) 500 MG tablet Take 500 mg by mouth every 6 (six) hours as needed for moderate pain.    Historical Provider, MD  metroNIDAZOLE (FLAGYL) 500 MG tablet Take 1 tablet (500 mg total) by mouth 2 (two) times daily. Patient not taking: Reported on 04/15/2015 10/28/14   Lahoma Crocker, MD   BP 135/83 mmHg  Pulse 101  Temp(Src) 98.9 F (37.2 C) (Oral)  Resp 14  SpO2 99% Physical Exam  Constitutional: She is oriented to person, place, and time. She appears well-developed and well-nourished. No distress.  HENT:  Head: Normocephalic and atraumatic.    Mouth/Throat: Oropharynx is clear and moist.  Bottom of incision with slight dehicense with granulation tissue there.   Eyes: EOM are normal. Pupils are equal, round, and reactive to light.  Neck: Normal range of motion. Neck supple.  Cardiovascular: Normal rate and regular rhythm.  Exam reveals no friction rub.   No murmur heard. Pulmonary/Chest: Effort normal and breath sounds normal. No respiratory distress. She has no wheezes. She has no rales.  Abdominal: Soft. She exhibits no distension. There is no tenderness. There is no rebound.  Musculoskeletal: Normal range of motion. She exhibits no edema.  Neurological: She is alert and oriented to person, place, and time.  Skin: She is not diaphoretic.  Nursing note and vitals reviewed.   ED Course  Wound repair Date/Time: 04/16/2015 10:47 AM Performed by: Evelina Bucy Authorized by: Evelina Bucy Consent: Verbal consent obtained. Preparation: Patient was prepped and draped in the usual sterile fashion. Local anesthesia used: yes Anesthesia: local infiltration Local anesthetic: lidocaine 1% with epinephrine Anesthetic total: 2 ml Patient sedated: no Patient tolerance: Patient tolerated the procedure well with no immediate complications Comments: 2  sutures, 4-0 silk, placed in lower cervical surgical wound with close approximation. Simple interrupted sutures placed, simple approximation method.   (including critical care time) Labs Review Labs Reviewed  CBC WITH DIFFERENTIAL/PLATELET - Abnormal; Notable for the following:    WBC 16.1 (*)    Neutro Abs 9.5 (*)    Lymphs Abs 5.2 (*)    All other components within normal limits  COMPREHENSIVE METABOLIC PANEL - Abnormal; Notable for the following:    CO2 21 (*)    Glucose, Bld 112 (*)    All other components within normal limits    Imaging Review No results found.   EKG Interpretation None      MDM   Final diagnoses:  Wound drainage    89F here with neck pain and headache. Recent Chiari malformation surgery in [place] by Dr. Charlyne Mom. Seen 3 days ago for same thing, was instructed to f/u with PCP and with Dr. Charlyne Mom if possible. ER spoke with Dr. Charlyne Mom 3 days ago and he did not think situatino  warranted emergent transfer.  Patient today sat up and felt fluid come from her wound. She hasn't had any drainage since she was in the ER 3 days ago. She's also complaining of a headache that is worse with standing up and better with lying down. This began after the drainage came from her wound. Wound well appearing, mild drainage when milking the wound. No purulence. Small area of dehiscence on lower portion of the wound. Dr. Charlyne Mom recommended silver nitrate to cauterize her wound, however when I went to cauterize the wound it was leaking when you milked the wound from top to bottom. He expects her to have headaches and her headache today does not concern him. Dr. Charlyne Mom was contacted again and stated this is expected, she has a semi-permeable graft and he expects mild fluid in the wound. He asked that I put some stitches in the wound, put her on a low-sodium diet, Diamox 250 mg QID for a week and lasix 20 mg daily for a week.   Evelina Bucy, MD 04/16/15 1050

## 2015-04-15 NOTE — ED Notes (Signed)
Patient sitting in up in bed without sunglasses. Denies any pain at this time.

## 2015-04-15 NOTE — ED Notes (Signed)
Laceration Tray at bedside. 

## 2015-04-15 NOTE — ED Notes (Signed)
MD at bedside. 

## 2015-04-15 NOTE — ED Notes (Signed)
Pt states she has a spinal leakage from brain surgery on May 24th. Pt present with an surgical incision with some minimal drainage, some redness and swelling noted to site. Pt states she has HA, tremors, neck and back pain.

## 2015-04-15 NOTE — Discharge Instructions (Signed)
°  Please adhere to a low-sodium diet.  Please take the diamox 4 times daily   Please take the furosemide (lasix) once daily.

## 2015-04-15 NOTE — ED Notes (Signed)
No drainage noted at incision site. Site coved with gauze and tape placed by patient.

## 2015-09-25 NOTE — H&P (Signed)
  Patient name [name], Gina Golden DICTATION# [id number] CSN# [id number]  [employer], MD 09/25/2015 2:29 PM

## 2015-09-25 NOTE — H&P (Signed)
NAME:  Gina Golden, Gina Golden                ACCOUNT NO.:  [id number]  MEDICAL RECORD NO.:  [id number]  LOCATION:  PERIO                         FACILITY:  [facility]  PHYSICIAN:  Darlyn Chamber, M.D.   DATE OF BIRTH:  09-29-1976  DATE OF ADMISSION:  08/28/2015 DATE OF DISCHARGE:                             HISTORY & PHYSICAL   HISTORY OF PRESENT ILLNESS:  Patient is a 39 year old, gravida 1, para 0 female who comes in for attempted laparoscopic-assisted vaginal hysterectomy with removal of both fallopian tubes and possibly both ovaries.  In relation to present admission, the patient has a past history of significant endometriosis with a previous diagnostic laparoscopy done in year 2000.  She has a Mirena IUD in place trying to control her periods. She has significant pelvic pain discomfort and pain with intercourse. This was continued despite conservative attempts.  We had discussed options with her.  This included trial of further hormonal suppression. Repeat laparoscopy versus more aggressive surgery is noted above which the patient is in favor of.  ALLERGIES:  In terms of allergies, she is allergic to CECLOR, SULFUR, TETRACYCLINE, EFFEXOR, DILAUDID, and LEVAQUIN.  MEDICATIONS:  Spironolactone 100 mg, Diamox as needed, Valtrex as needed, Trileptal 750 mg, metformin 1000 mg, tramadol, and Adderall.  PAST MEDICAL HISTORY:  She has a history of psoriasis.  She has a history of arthritis, has a history of diverticulosis.  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  She recently underwent a decompression surgery for that. Because of this, she has a history of a seizure disorder.  Does have a history of migraine headaches and past history of abnormal cervical cytology.  PAST SURGICAL HISTORY:  This year she had decompression surgery for the Chiari malformation.  In 2011, she had a mastoidectomy.  In 2010, she had a partial mastectomy and lumpectomy for  benign reasons.  In 2000, she had laparoscopy for endometriosis.  In 1979, she had adenoidectomy. In 2016, she also had a myringotomy.  Past history also the loop electrosurgical excision procedure of the cervix to find a moderate cervical dysplasia.  SOCIAL HISTORY:  In terms of social history, she has no tobacco or alcohol use.  FAMILY HISTORY:  Noncontributory.  REVIEW OF SYSTEMS:  Noncontributory.  PHYSICAL EXAMINATION:  VITAL SIGNS:  The patient is afebrile, stable vital signs. HEENT:  The patient is normocephalic.  Pupils equal, round, and reactive to light and accommodation.  Extraocular movements were intact.  Sclerae and conjunctivae were clear.  Oropharynx clear. NECK:  No thyromegaly. BREASTS:  No dominant masses, adenopathy, or nipple discharge. LUNGS:  Clear. CARDIAC:  Regular rate without murmurs or gallops.  No carotid or abdominal bruits. ABDOMINAL:  Benign.  No mass, organomegaly, or tenderness. PELVIC:  Normal external genitalia.  Vaginal mucosa is clear.  Cervix unremarkable.  Uterus normal size, shape, and contour.  Adnexa free of mass or tenderness. EXTREMITIES:  Trace edema. NEUROLOGIC:  Grossly within normal limits.  IMPRESSION: 1. History of severe pelvic endometriosis with worsening pelvic pain     and discomfort. 2. History of Chiari malformation, previous decompression surgery. 3. History of seizure disorders. 4. Psoriasis. 5. Arthritis.  6. Migraine headaches. 7. History of cervical dysplasia.  PLAN:  The patient will undergo the above-noted surgery.  Again alternatives have been discussed.  The risks of surgery have been explained including the risk of infection.  The risk of hemorrhage, this could require transfusion with the risk of AIDS or hepatitis.  There is a risk of injury to adjacent organs including bladder, bowel, ureters that could require further exploratory surgery.  Risk of deep venous thrombosis and pulmonary embolus.  The  patient expressed understanding of indications, risks, and alternatives.     Darlyn Chamber, M.D.     JSM/MEDQ  D:  09/25/2015  T:  09/25/2015  Job:  [id number]

## 2015-09-29 ENCOUNTER — Encounter (HOSPITAL_COMMUNITY)
Admission: RE | Admit: 2015-09-29 | Discharge: 2015-09-29 | Disposition: A | Discharge status: Home / Self Care | Payer: BLUE CROSS/BLUE SHIELD | Source: Ambulatory Visit | Attending: Obstetrics and Gynecology | Admitting: Obstetrics and Gynecology

## 2015-09-29 ENCOUNTER — Encounter (HOSPITAL_COMMUNITY): Payer: Self-pay

## 2015-09-29 DIAGNOSIS — Z01818 Encounter for other preprocedural examination: Secondary | ICD-10-CM | POA: Diagnosis not present

## 2015-09-29 DIAGNOSIS — N809 Endometriosis, unspecified: Secondary | ICD-10-CM | POA: Insufficient documentation

## 2015-09-29 HISTORY — DX: Other specified postprocedural states: R11.2

## 2015-09-29 HISTORY — DX: Sleep apnea, unspecified: G47.30

## 2015-09-29 HISTORY — DX: Aphasia: R47.01

## 2015-09-29 HISTORY — DX: Other specified postprocedural states: Z98.890

## 2015-09-29 HISTORY — DX: Cardiac arrhythmia, unspecified: I49.9

## 2015-09-29 HISTORY — DX: Hypermobile Ehlers-Danlos syndrome: Q79.62

## 2015-09-29 HISTORY — DX: Reserved for inherently not codable concepts without codable children: IMO0001

## 2015-09-29 HISTORY — DX: Tremor, unspecified: R25.1

## 2015-09-29 LAB — BASIC METABOLIC PANEL
Anion gap: 10 (ref 5–15)
BUN: 11 mg/dL (ref 6–20)
CALCIUM: 9.3 mg/dL (ref 8.9–10.3)
CO2: 22 mmol/L (ref 22–32)
CREATININE: 0.76 mg/dL (ref 0.44–1.00)
Chloride: 108 mmol/L (ref 101–111)
GFR calc non Af Amer: >60 mL/min (ref >60)
Glucose, Bld: 108 mg/dL — ABNORMAL HIGH (ref 65–99)
Potassium: 4.2 mmol/L (ref 3.5–5.1)
Sodium: 140 mmol/L (ref 135–145)

## 2015-09-29 LAB — CBC
HCT: 42.7 % (ref 36.0–46.0)
HEMOGLOBIN: 14.6 g/dL (ref 12.0–15.0)
MCH: 27.9 pg (ref 26.0–34.0)
MCHC: 34.2 g/dL (ref 30.0–36.0)
MCV: 81.5 fL (ref 78.0–100.0)
Platelets: 353 10*3/uL (ref 150–400)
RBC: 5.24 MIL/uL — ABNORMAL HIGH (ref 3.87–5.11)
RDW: 14.1 % (ref 11.5–15.5)
WBC: 12.2 10*3/uL — ABNORMAL HIGH (ref 4.0–10.5)

## 2015-09-29 NOTE — Patient Instructions (Addendum)
Your procedure is scheduled on:  Thursday, Dec. 1, 2016  Enter through the [employer] [facility] at:  6:00 AM  Pick up the phone at the desk and dial [phone].  Call this number if you have problems the morning of surgery: [phone].  Remember: Do NOT eat food or drink after:  Midnight Wednesday, Nov. 30, 2016 Take these medicines the morning of surgery with a SIP OF WATER:  Omeprazole, Oxcarbazepine, Spironolactone, Adderall  DO NOT TAKE METFORMIN 24 HOURS PRIOR TO SURGERY  Do NOT wear jewelry (body piercing), metal hair clips/bobby pins, make-up, or nail polish. Do NOT wear lotions, powders, or perfumes.  You may wear deoderant. Do NOT shave for 48 hours prior to surgery. Do NOT bring valuables to the hospital. Contacts, dentures, or bridgework may not be worn into surgery. Leave suitcase in car.  After surgery it may be brought to your room.  For patients admitted to the hospital, checkout time is 11:00 AM the day of discharge.

## 2015-09-29 NOTE — Pre-Procedure Instructions (Signed)
Dr. Tresa Moore saw patient for anesthesia consult, see his note

## 2015-09-29 NOTE — Progress Notes (Signed)
Further research of the medical record indicates she had the Chiari surgery on 04/05/2015 by Dr. Charlyne Mom.  He is aware of her planned GYN surgery and sees no problem having the GYN surgery.  She is to see him again on 10/06/2015 and will bring documentation showing his approval of the planned GYN surgery.  This information was confirmed by a phone call from me to the patient at 16:30 on 09/29/2015.

## 2015-09-29 NOTE — Progress Notes (Signed)
73 yro female for LAVH by J. Mccomb 10/09/2015.  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  She did well with a Propofol Infusion based anesthetic for her neuro procedure and would like a similar anesthetic for her LAVH.  She will bring her previous anesthetic record with her.  I told her intubation would more than likely be done with Glide Scope.  Assured her she would receive PONV prophylaxis.  Obesity and occasional smoking are also concerns.

## 2015-10-08 MED ORDER — GENTAMICIN SULFATE 40 MG/ML IJ SOLN
5.0000 mg/kg | INTRAVENOUS | Status: AC
  Administered 2015-10-09: 380.8 mg via INTRAVENOUS
  Filled 2015-10-08: qty 9.5

## 2015-10-08 MED ORDER — METRONIDAZOLE IN NACL 5-0.79 MG/ML-% IV SOLN
500.0000 mg | INTRAVENOUS | Status: AC
  Administered 2015-10-09: 500 mg via INTRAVENOUS
  Filled 2015-10-08: qty 100

## 2015-10-09 ENCOUNTER — Encounter (HOSPITAL_COMMUNITY): Payer: Self-pay

## 2015-10-09 ENCOUNTER — Observation Stay (HOSPITAL_COMMUNITY)
Admission: RE | Admit: 2015-10-09 | Discharge: 2015-10-10 | Disposition: A | Discharge status: Home / Self Care | Payer: BLUE CROSS/BLUE SHIELD | Source: Ambulatory Visit | Attending: Obstetrics and Gynecology | Admitting: Obstetrics and Gynecology

## 2015-10-09 ENCOUNTER — Ambulatory Visit (HOSPITAL_COMMUNITY): Payer: BLUE CROSS/BLUE SHIELD | Admitting: Anesthesiology

## 2015-10-09 ENCOUNTER — Encounter (HOSPITAL_COMMUNITY)
Admission: RE | Disposition: A | Discharge status: Home / Self Care | Payer: Self-pay | Source: Ambulatory Visit | Attending: Obstetrics and Gynecology

## 2015-10-09 DIAGNOSIS — F17210 Nicotine dependence, cigarettes, uncomplicated: Secondary | ICD-10-CM | POA: Insufficient documentation

## 2015-10-09 DIAGNOSIS — N7011 Chronic salpingitis: Secondary | ICD-10-CM | POA: Insufficient documentation

## 2015-10-09 DIAGNOSIS — D259 Leiomyoma of uterus, unspecified: Secondary | ICD-10-CM | POA: Diagnosis not present

## 2015-10-09 DIAGNOSIS — M1990 Unspecified osteoarthritis, unspecified site: Secondary | ICD-10-CM | POA: Diagnosis not present

## 2015-10-09 DIAGNOSIS — N920 Excessive and frequent menstruation with regular cycle: Secondary | ICD-10-CM | POA: Diagnosis not present

## 2015-10-09 DIAGNOSIS — N83209 Unspecified ovarian cyst, unspecified side: Secondary | ICD-10-CM | POA: Diagnosis not present

## 2015-10-09 DIAGNOSIS — N941 Unspecified dyspareunia: Secondary | ICD-10-CM | POA: Diagnosis not present

## 2015-10-09 DIAGNOSIS — R569 Unspecified convulsions: Secondary | ICD-10-CM | POA: Insufficient documentation

## 2015-10-09 DIAGNOSIS — Z6838 Body mass index (BMI) 38.0-38.9, adult: Secondary | ICD-10-CM | POA: Insufficient documentation

## 2015-10-09 DIAGNOSIS — Z90710 Acquired absence of both cervix and uterus: Secondary | ICD-10-CM

## 2015-10-09 DIAGNOSIS — L409 Psoriasis, unspecified: Secondary | ICD-10-CM | POA: Insufficient documentation

## 2015-10-09 DIAGNOSIS — R102 Pelvic and perineal pain: Secondary | ICD-10-CM | POA: Diagnosis not present

## 2015-10-09 DIAGNOSIS — N803 Endometriosis of pelvic peritoneum: Secondary | ICD-10-CM | POA: Insufficient documentation

## 2015-10-09 HISTORY — PX: LAPAROSCOPIC ASSISTED VAGINAL HYSTERECTOMY: SHX5398

## 2015-10-09 HISTORY — PX: SALPINGOOPHORECTOMY: SHX82

## 2015-10-09 LAB — GLUCOSE, CAPILLARY: GLUCOSE-CAPILLARY: 116 mg/dL — AB (ref 65–99)

## 2015-10-09 LAB — HCG, SERUM, QUALITATIVE: PREG SERUM: NEGATIVE

## 2015-10-09 SURGERY — HYSTERECTOMY, VAGINAL, LAPAROSCOPY-ASSISTED
Anesthesia: General | Site: Abdomen

## 2015-10-09 MED ORDER — FENTANYL CITRATE (PF) 100 MCG/2ML IJ SOLN
INTRAMUSCULAR | Status: DC | PRN
  Administered 2015-10-09 (×3): 50 ug via INTRAVENOUS
  Administered 2015-10-09: 100 ug via INTRAVENOUS
  Administered 2015-10-09 (×2): 50 ug via INTRAVENOUS

## 2015-10-09 MED ORDER — DEXAMETHASONE SODIUM PHOSPHATE 4 MG/ML IJ SOLN
INTRAMUSCULAR | Status: AC
  Filled 2015-10-09: qty 1

## 2015-10-09 MED ORDER — PANTOPRAZOLE SODIUM 40 MG PO TBEC
DELAYED_RELEASE_TABLET | ORAL | Status: AC
  Administered 2015-10-09: 40 mg via ORAL
  Filled 2015-10-09: qty 1

## 2015-10-09 MED ORDER — FENTANYL CITRATE (PF) 100 MCG/2ML IJ SOLN
INTRAMUSCULAR | Status: AC
  Administered 2015-10-09: 50 ug via INTRAVENOUS
  Filled 2015-10-09: qty 2

## 2015-10-09 MED ORDER — LIDOCAINE HCL (CARDIAC) 20 MG/ML IV SOLN
INTRAVENOUS | Status: DC | PRN
  Administered 2015-10-09: 30 mg via INTRAVENOUS

## 2015-10-09 MED ORDER — ACETAMINOPHEN 325 MG PO TABS: 650.0000 mg | ORAL_TABLET | ORAL | Status: DC | PRN

## 2015-10-09 MED ORDER — FLUORESCEIN SODIUM 10 % IJ SOLN
INTRAMUSCULAR | Status: AC
  Filled 2015-10-09: qty 5

## 2015-10-09 MED ORDER — MEPERIDINE HCL 25 MG/ML IJ SOLN
INTRAMUSCULAR | Status: AC
  Administered 2015-10-09: 6.25 mg via INTRAVENOUS
  Filled 2015-10-09: qty 1

## 2015-10-09 MED ORDER — MENTHOL 3 MG MT LOZG: 1.0000 | LOZENGE | OROMUCOSAL | Status: DC | PRN

## 2015-10-09 MED ORDER — DOCUSATE SODIUM 100 MG PO CAPS
100.0000 mg | ORAL_CAPSULE | Freq: Two times a day (BID) | ORAL | Status: DC
  Administered 2015-10-09 – 2015-10-10 (×2): 100 mg via ORAL
  Filled 2015-10-09 (×2): qty 1

## 2015-10-09 MED ORDER — FENTANYL CITRATE (PF) 100 MCG/2ML IJ SOLN
INTRAMUSCULAR | Status: AC
Start: 2015-10-09 — End: 2015-10-09
  Filled 2015-10-09: qty 2

## 2015-10-09 MED ORDER — BUPIVACAINE HCL (PF) 0.25 % IJ SOLN
INTRAMUSCULAR | Status: DC | PRN
  Administered 2015-10-09: 7 mL

## 2015-10-09 MED ORDER — NALOXONE HCL 0.4 MG/ML IJ SOLN: 0.4000 mg | INTRAMUSCULAR | Status: DC | PRN

## 2015-10-09 MED ORDER — ONDANSETRON HCL 4 MG/2ML IJ SOLN: 4.0000 mg | Freq: Four times a day (QID) | INTRAMUSCULAR | Status: DC | PRN

## 2015-10-09 MED ORDER — OXCARBAZEPINE 300 MG PO TABS
450.0000 mg | ORAL_TABLET | Freq: Every day | ORAL | Status: DC
  Administered 2015-10-09: 450 mg via ORAL
  Filled 2015-10-09 (×2): qty 1

## 2015-10-09 MED ORDER — ONDANSETRON HCL 4 MG/2ML IJ SOLN
INTRAMUSCULAR | Status: DC | PRN
  Administered 2015-10-09: 4 mg via INTRAVENOUS

## 2015-10-09 MED ORDER — MEPERIDINE HCL 25 MG/ML IJ SOLN
6.2500 mg | INTRAMUSCULAR | Status: DC | PRN
  Administered 2015-10-09: 6.25 mg via INTRAVENOUS

## 2015-10-09 MED ORDER — STERILE WATER FOR IRRIGATION IR SOLN
Status: DC | PRN
  Administered 2015-10-09: 1000 mL via INTRAVESICAL

## 2015-10-09 MED ORDER — LACTATED RINGERS IR SOLN
Status: DC | PRN
  Administered 2015-10-09: 1000 mL

## 2015-10-09 MED ORDER — GLYCOPYRROLATE 0.2 MG/ML IJ SOLN
INTRAMUSCULAR | Status: DC | PRN
  Administered 2015-10-09: 0.6 mg via INTRAVENOUS

## 2015-10-09 MED ORDER — LACTATED RINGERS IV SOLN
INTRAVENOUS | Status: DC
  Administered 2015-10-09: 09:00:00 via INTRAVENOUS
  Administered 2015-10-09: 10 mL/h via INTRAVENOUS

## 2015-10-09 MED ORDER — DEXAMETHASONE SODIUM PHOSPHATE 10 MG/ML IJ SOLN
INTRAMUSCULAR | Status: DC | PRN
  Administered 2015-10-09: 4 mg via INTRAVENOUS

## 2015-10-09 MED ORDER — OXCARBAZEPINE 300 MG PO TABS: 300.0000 mg | ORAL_TABLET | Freq: Two times a day (BID) | ORAL | Status: DC

## 2015-10-09 MED ORDER — KETOROLAC TROMETHAMINE 30 MG/ML IJ SOLN
INTRAMUSCULAR | Status: DC | PRN
  Administered 2015-10-09: 30 mg via INTRAVENOUS

## 2015-10-09 MED ORDER — ONDANSETRON HCL 4 MG PO TABS: 4.0000 mg | ORAL_TABLET | Freq: Four times a day (QID) | ORAL | Status: DC | PRN

## 2015-10-09 MED ORDER — HYDROMORPHONE HCL 2 MG PO TABS: 2.0000 mg | ORAL_TABLET | ORAL | Status: DC | PRN

## 2015-10-09 MED ORDER — SUCCINYLCHOLINE CHLORIDE 20 MG/ML IJ SOLN
INTRAMUSCULAR | Status: AC
  Filled 2015-10-09: qty 1

## 2015-10-09 MED ORDER — PROPOFOL 500 MG/50ML IV EMUL
INTRAVENOUS | Status: DC | PRN
  Administered 2015-10-09 (×2): 125 ug via INTRAVENOUS
  Administered 2015-10-09: 525 ug via INTRAVENOUS
  Administered 2015-10-09: 100 ug via INTRAVENOUS
  Administered 2015-10-09: 75 ug via INTRAVENOUS

## 2015-10-09 MED ORDER — PROPOFOL 10 MG/ML IV BOLUS
INTRAVENOUS | Status: DC | PRN
  Administered 2015-10-09: 200 mg via INTRAVENOUS

## 2015-10-09 MED ORDER — PROPOFOL 10 MG/ML IV BOLUS
INTRAVENOUS | Status: AC
  Filled 2015-10-09: qty 20

## 2015-10-09 MED ORDER — FLUMAZENIL 0.5 MG/5ML IV SOLN
INTRAVENOUS | Status: AC
  Filled 2015-10-09: qty 5

## 2015-10-09 MED ORDER — PANTOPRAZOLE SODIUM 40 MG PO TBEC
40.0000 mg | DELAYED_RELEASE_TABLET | Freq: Once | ORAL | Status: AC
  Administered 2015-10-09: 40 mg via ORAL

## 2015-10-09 MED ORDER — DIPHENHYDRAMINE HCL 12.5 MG/5ML PO ELIX: 12.5000 mg | ORAL_SOLUTION | Freq: Four times a day (QID) | ORAL | Status: DC | PRN

## 2015-10-09 MED ORDER — FENTANYL 40 MCG/ML IV SOLN
INTRAVENOUS | Status: DC
  Administered 2015-10-09 (×2): 90 ug via INTRAVENOUS
  Administered 2015-10-09: 12:00:00 via INTRAVENOUS
  Administered 2015-10-09: 110 ug via INTRAVENOUS
  Administered 2015-10-10: 60 ug via INTRAVENOUS
  Administered 2015-10-10: 30 ug via INTRAVENOUS
  Filled 2015-10-09: qty 25

## 2015-10-09 MED ORDER — NEOSTIGMINE METHYLSULFATE 10 MG/10ML IV SOLN
INTRAVENOUS | Status: AC
  Filled 2015-10-09: qty 1

## 2015-10-09 MED ORDER — BUPIVACAINE HCL (PF) 0.25 % IJ SOLN
INTRAMUSCULAR | Status: AC
  Filled 2015-10-09: qty 30

## 2015-10-09 MED ORDER — MIDAZOLAM HCL 2 MG/2ML IJ SOLN
INTRAMUSCULAR | Status: AC
  Filled 2015-10-09: qty 2

## 2015-10-09 MED ORDER — LACTATED RINGERS IV SOLN
INTRAVENOUS | Status: DC
  Administered 2015-10-09 – 2015-10-10 (×3): via INTRAVENOUS

## 2015-10-09 MED ORDER — SCOPOLAMINE 1 MG/3DAYS TD PT72
1.0000 | MEDICATED_PATCH | Freq: Once | TRANSDERMAL | Status: DC
  Administered 2015-10-09: 1.5 mg via TRANSDERMAL

## 2015-10-09 MED ORDER — METFORMIN HCL ER 500 MG PO TB24
500.0000 mg | ORAL_TABLET | Freq: Two times a day (BID) | ORAL | Status: DC
  Filled 2015-10-09 (×2): qty 1

## 2015-10-09 MED ORDER — FLUMAZENIL 0.5 MG/5ML IV SOLN
INTRAVENOUS | Status: DC | PRN
  Administered 2015-10-09: 0.2 mg via INTRAVENOUS

## 2015-10-09 MED ORDER — FENTANYL CITRATE (PF) 100 MCG/2ML IJ SOLN
25.0000 ug | INTRAMUSCULAR | Status: DC | PRN
  Administered 2015-10-09 (×3): 50 ug via INTRAVENOUS

## 2015-10-09 MED ORDER — ONDANSETRON HCL 4 MG/2ML IJ SOLN
INTRAMUSCULAR | Status: AC
  Filled 2015-10-09: qty 2

## 2015-10-09 MED ORDER — SUCCINYLCHOLINE CHLORIDE 200 MG/10ML IV SOSY
PREFILLED_SYRINGE | INTRAVENOUS | Status: DC | PRN
  Administered 2015-10-09: 120 mg via INTRAVENOUS

## 2015-10-09 MED ORDER — ROCURONIUM BROMIDE 100 MG/10ML IV SOLN
INTRAVENOUS | Status: DC | PRN
  Administered 2015-10-09: 10 mg via INTRAVENOUS
  Administered 2015-10-09: 40 mg via INTRAVENOUS

## 2015-10-09 MED ORDER — DIPHENHYDRAMINE HCL 50 MG/ML IJ SOLN: 12.5000 mg | Freq: Four times a day (QID) | INTRAMUSCULAR | Status: DC | PRN

## 2015-10-09 MED ORDER — PROPOFOL 500 MG/50ML IV EMUL
INTRAVENOUS | Status: AC
Start: 2015-10-09 — End: 2015-10-09
  Filled 2015-10-09: qty 100

## 2015-10-09 MED ORDER — GLYCOPYRROLATE 0.2 MG/ML IJ SOLN
INTRAMUSCULAR | Status: AC
  Filled 2015-10-09: qty 3

## 2015-10-09 MED ORDER — MIDAZOLAM HCL 2 MG/2ML IJ SOLN
INTRAMUSCULAR | Status: DC | PRN
  Administered 2015-10-09: 1 mg via INTRAVENOUS

## 2015-10-09 MED ORDER — ACETAZOLAMIDE 250 MG PO TABS
250.0000 mg | ORAL_TABLET | Freq: Four times a day (QID) | ORAL | Status: DC
  Administered 2015-10-09 – 2015-10-10 (×3): 250 mg via ORAL
  Filled 2015-10-09 (×5): qty 1

## 2015-10-09 MED ORDER — KETOROLAC TROMETHAMINE 30 MG/ML IJ SOLN
INTRAMUSCULAR | Status: AC
  Filled 2015-10-09: qty 1

## 2015-10-09 MED ORDER — OXCARBAZEPINE 300 MG PO TABS
300.0000 mg | ORAL_TABLET | Freq: Every day | ORAL | Status: DC
  Administered 2015-10-10: 300 mg via ORAL
  Filled 2015-10-09: qty 1

## 2015-10-09 MED ORDER — PROMETHAZINE HCL 25 MG/ML IJ SOLN
6.2500 mg | INTRAMUSCULAR | Status: DC | PRN
Start: 2015-10-09 — End: 2015-10-09

## 2015-10-09 MED ORDER — FENTANYL CITRATE (PF) 250 MCG/5ML IJ SOLN
INTRAMUSCULAR | Status: AC
  Filled 2015-10-09: qty 5

## 2015-10-09 MED ORDER — FENTANYL CITRATE (PF) 100 MCG/2ML IJ SOLN
INTRAMUSCULAR | Status: AC
  Filled 2015-10-09: qty 2

## 2015-10-09 MED ORDER — ROCURONIUM BROMIDE 100 MG/10ML IV SOLN
INTRAVENOUS | Status: AC
  Filled 2015-10-09: qty 1

## 2015-10-09 MED ORDER — NEOSTIGMINE METHYLSULFATE 10 MG/10ML IV SOLN
INTRAVENOUS | Status: DC | PRN
  Administered 2015-10-09: 4 mg via INTRAVENOUS

## 2015-10-09 MED ORDER — LIDOCAINE HCL (CARDIAC) 20 MG/ML IV SOLN
INTRAVENOUS | Status: AC
  Filled 2015-10-09: qty 5

## 2015-10-09 MED ORDER — SCOPOLAMINE 1 MG/3DAYS TD PT72
MEDICATED_PATCH | TRANSDERMAL | Status: AC
  Administered 2015-10-09: 1.5 mg via TRANSDERMAL
  Filled 2015-10-09: qty 1

## 2015-10-09 MED ORDER — ARTIFICIAL TEARS OP OINT
TOPICAL_OINTMENT | OPHTHALMIC | Status: AC
  Filled 2015-10-09: qty 3.5

## 2015-10-09 MED ORDER — FLUORESCEIN SODIUM 10 % IJ SOLN
500.0000 mg | Freq: Once | INTRAMUSCULAR | Status: DC
  Filled 2015-10-09: qty 5

## 2015-10-09 MED ORDER — SODIUM CHLORIDE 0.9 % IJ SOLN: 9.0000 mL | INTRAMUSCULAR | Status: DC | PRN

## 2015-10-09 SURGICAL SUPPLY — 46 items
CABLE HIGH FREQUENCY MONO STRZ (ELECTRODE) IMPLANT
CATH ROBINSON RED A/P 16FR (CATHETERS) ×4 IMPLANT
CLOSURE WOUND 1/4 X3 (GAUZE/BANDAGES/DRESSINGS)
CLOTH BEACON ORANGE TIMEOUT ST (SAFETY) ×4 IMPLANT
CONT PATH 16OZ SNAP LID 3702 (MISCELLANEOUS) ×4 IMPLANT
COVER BACK TABLE 60X90IN (DRAPES) ×4 IMPLANT
DECANTER SPIKE VIAL GLASS SM (MISCELLANEOUS) ×2 IMPLANT
DRSG COVADERM PLUS 2X2 (GAUZE/BANDAGES/DRESSINGS) ×8 IMPLANT
DRSG OPSITE POSTOP 3X4 (GAUZE/BANDAGES/DRESSINGS) ×2 IMPLANT
DURAPREP 26ML APPLICATOR (WOUND CARE) ×4 IMPLANT
ELECT REM PT RETURN 9FT ADLT (ELECTROSURGICAL) ×4
ELECTRODE REM PT RTRN 9FT ADLT (ELECTROSURGICAL) IMPLANT
EVACUATOR SMOKE 8.L (FILTER) IMPLANT
GLOVE BIO SURGEON STRL SZ7 (GLOVE) ×8 IMPLANT
GLOVE BIOGEL PI IND STRL 6.5 (GLOVE) ×2 IMPLANT
GLOVE BIOGEL PI IND STRL 7.0 (GLOVE) ×6 IMPLANT
GLOVE BIOGEL PI INDICATOR 6.5 (GLOVE) ×2
GLOVE BIOGEL PI INDICATOR 7.0 (GLOVE) ×6
LEGGING LITHOTOMY PAIR STRL (DRAPES) ×4 IMPLANT
LIQUID BAND (GAUZE/BANDAGES/DRESSINGS) ×4 IMPLANT
NEEDLE INSUFFLATION 120MM (ENDOMECHANICALS) ×2 IMPLANT
NS IRRIG 1000ML POUR BTL (IV SOLUTION) ×4 IMPLANT
PACK LAVH (CUSTOM PROCEDURE TRAY) ×4 IMPLANT
PACK ROBOTIC GOWN (GOWN DISPOSABLE) ×4 IMPLANT
PAD POSITIONING PINK XL (MISCELLANEOUS) ×4 IMPLANT
SEALER TISSUE G2 CVD JAW 45CM (ENDOMECHANICALS) ×4 IMPLANT
SET CYSTO W/LG BORE CLAMP LF (SET/KITS/TRAYS/PACK) ×2 IMPLANT
SET IRRIG TUBING LAPAROSCOPIC (IRRIGATION / IRRIGATOR) ×2 IMPLANT
STRIP CLOSURE SKIN 1/4X3 (GAUZE/BANDAGES/DRESSINGS) IMPLANT
SUT MON AB 2-0 CT1 27 (SUTURE) ×8 IMPLANT
SUT VIC AB 0 CT1 18XCR BRD8 (SUTURE) ×4 IMPLANT
SUT VIC AB 0 CT1 27 (SUTURE) ×4
SUT VIC AB 0 CT1 27XBRD ANBCTR (SUTURE) ×2 IMPLANT
SUT VIC AB 0 CT1 36 (SUTURE) ×4 IMPLANT
SUT VIC AB 0 CT1 8-18 (SUTURE) ×8
SUT VICRYL 0 UR6 27IN ABS (SUTURE) ×2 IMPLANT
SUT VICRYL 1 TIES 12X18 (SUTURE) ×4 IMPLANT
SUT VICRYL 4-0 PS2 18IN ABS (SUTURE) ×4 IMPLANT
TOWEL OR 17X24 6PK STRL BLUE (TOWEL DISPOSABLE) ×8 IMPLANT
TRAY FOLEY CATH SILVER 14FR (SET/KITS/TRAYS/PACK) ×4 IMPLANT
TROCAR BALLN 12MMX100 BLUNT (TROCAR) ×2 IMPLANT
TROCAR OPTI TIP 5M 100M (ENDOMECHANICALS) ×6 IMPLANT
TROCAR XCEL DIL TIP R 11M (ENDOMECHANICALS) IMPLANT
WARMER LAPAROSCOPE (MISCELLANEOUS) ×4 IMPLANT
WATER STERILE IRR 1000ML POUR (IV SOLUTION) ×4 IMPLANT
WATER STERILE IRR 1000ML UROMA (IV SOLUTION) ×2 IMPLANT

## 2015-10-09 NOTE — Transfer of Care (Signed)
Immediate Anesthesia Transfer of Care Note  Patient: Gina Golden  Procedure(s) Performed: Procedure(s): LAPAROSCOPIC ASSISTED VAGINAL HYSTERECTOMY (N/A) SALPINGO OOPHORECTOMY (Bilateral)  Patient Location: PACU  Anesthesia Type:General  Level of Consciousness: awake, alert , oriented and patient cooperative  Airway & Oxygen Therapy: Patient Spontanous Breathing and Patient connected to nasal cannula oxygen  Post-op Assessment: Report given to RN and Post -op Vital signs reviewed and stable  Post vital signs: Reviewed and stable  Last Vitals:  Filed Vitals:   10/09/15 0708  BP: 126/85  Pulse: 98  Temp: 36.9 C  Resp: 20    Complications: No apparent anesthesia complications

## 2015-10-09 NOTE — Anesthesia Preprocedure Evaluation (Addendum)
Anesthesia Evaluation  Patient identified by MRN, date of birth, ID band Patient awake    Reviewed: Allergy & Precautions, H&P , NPO status , Patient's Chart, lab work & pertinent test results, reviewed documented beta blocker date and time   History of Anesthesia Complications (+) PONV  Airway Mallampati: II  TM Distance: >3 FB Neck ROM: full    Dental no notable dental hx. (+) Teeth Intact   Pulmonary Current Smoker,    Pulmonary exam normal        Cardiovascular negative cardio ROS Normal cardiovascular exam     Neuro/Psych PSYCHIATRIC DISORDERS Bipolar Disorder    GI/Hepatic Neg liver ROS, GERD  ,  Endo/Other  Morbid obesity  Renal/GU negative Renal ROS     Musculoskeletal   Abdominal (+) + obese,   Peds  Hematology negative hematology ROS (+)   Anesthesia Other Findings   Reproductive/Obstetrics negative OB ROS                            Anesthesia Physical Anesthesia Plan  ASA: III  Anesthesia Plan: General   Post-op Pain Management:    Induction: Intravenous  Airway Management Planned: Oral ETT  Additional Equipment:   Intra-op Plan:   Post-operative Plan: Extubation in OR  Informed Consent: I have reviewed the patients History and Physical, chart, labs and discussed the procedure including the risks, benefits and alternatives for the proposed anesthesia with the patient or authorized representative who has indicated his/her understanding and acceptance.   Dental Advisory Given  Plan Discussed with: CRNA and Surgeon  Anesthesia Plan Comments:        Anesthesia Quick Evaluation

## 2015-10-09 NOTE — H&P (Signed)
  History and physical exam unchanged 

## 2015-10-09 NOTE — Anesthesia Postprocedure Evaluation (Signed)
Anesthesia Post Note  Patient: Gina Golden  Procedure(s) Performed: Procedure(s) (LRB): LAPAROSCOPIC ASSISTED VAGINAL HYSTERECTOMY (N/A) SALPINGO OOPHORECTOMY (Bilateral)  Patient location during evaluation: PACU Anesthesia Type: General Level of consciousness: awake Pain management: pain level controlled Respiratory status: spontaneous breathing Cardiovascular status: stable Postop Assessment: no signs of nausea or vomiting Anesthetic complications: no    Last Vitals:  Filed Vitals:   10/09/15 2200 10/09/15 2220  BP:  91/47  Pulse:  71  Temp:  37.2 C  Resp: 19 18    Last Pain:  Filed Vitals:   10/09/15 2226  PainSc: [place]

## 2015-10-09 NOTE — Progress Notes (Signed)
Patient ID: Gina Golden, female   DOB: 08/15/1976, 39 y.o.   MRN: [id number] AF VSS ABD SOFT INC CLEAR GOOD UO MINIMAL BLEEDING

## 2015-10-09 NOTE — Plan of Care (Signed)
Problem: Self-Concept: Goal: Communication of feelings regarding changes in body function or appearance will improve Outcome: Progressing Pt is communicating about recent changes in her life that have changed her abilities to function regularly. She is working through coping with changes and has a very supportive mother and SO.

## 2015-10-09 NOTE — Anesthesia Procedure Notes (Signed)
Procedure Name: Intubation Date/Time: 10/09/2015 7:33 AM Performed by: Tobin Chad Pre-anesthesia Checklist: Patient identified, Emergency Drugs available, Patient being monitored, Suction available and Timeout performed Patient Re-evaluated:Patient Re-evaluated prior to inductionOxygen Delivery Method: Circle system utilized and Simple face mask Preoxygenation: Pre-oxygenation with 100% oxygen Intubation Type: IV induction Ventilation: Mask ventilation without difficulty Grade View: Grade I Tube type: Oral Number of attempts: 1 Airway Equipment and Method: Video-laryngoscopy Dental Injury: Teeth and Oropharynx as per pre-operative assessment  Difficulty Due To: Difficulty was anticipated Future Recommendations: Recommend- induction with short-acting agent, and alternative techniques readily available

## 2015-10-09 NOTE — Anesthesia Postprocedure Evaluation (Addendum)
Anesthesia Post Note  Patient: Gina Golden  Procedure(s) Performed: Procedure(s) (LRB): LAPAROSCOPIC ASSISTED VAGINAL HYSTERECTOMY (N/A) SALPINGO OOPHORECTOMY (Bilateral)  Patient location during evaluation: [facility] Anesthesia Type: General Level of consciousness: awake Pain management: pain level controlled Vital Signs Assessment: post-procedure vital signs reviewed and stable Respiratory status: spontaneous breathing and patient connected to nasal cannula oxygen Cardiovascular status: stable Postop Assessment: adequate PO intake and no signs of nausea or vomiting Anesthetic complications: no    Last Vitals:  Filed Vitals:   10/09/15 1349 10/09/15 1400  BP: 115/73   Pulse: 77   Temp:    Resp: 16 18    Last Pain:  Filed Vitals:   10/09/15 1439  PainSc: 4                  Georgann Bramble

## 2015-10-09 NOTE — Brief Op Note (Signed)
10/09/2015  9:15 AM  PATIENT:  Gina Golden  39 y.o. female  PRE-OPERATIVE DIAGNOSIS:  ENDOMETRIOSIS, PAIN  POST-OPERATIVE DIAGNOSIS:  ENDOMETRIOSIS, PAIN  PROCEDURE:  Procedure(s): LAPAROSCOPIC ASSISTED VAGINAL HYSTERECTOMY (N/A) SALPINGO OOPHORECTOMY (Bilateral)  SURGEON:  Surgeon(s) and Role:    * Arvella Nigh, MD - Primary    * Linda Hedges, DO - Assisting  PHYSICIAN ASSISTANT:   ASSISTANTS: morris    ANESTHESIA:   local and general  EBL:  Total I/O In: 1400 [I.V.:1400] Out: 250 [Urine:100; Blood:150]  BLOOD ADMINISTERED:none  DRAINS: Urinary Catheter (Foley)   LOCAL MEDICATIONS USED:  MARCAINE     SPECIMEN:  Source of Specimen:  uterus tubes and ovaries  DISPOSITION OF SPECIMEN:  PATHOLOGY  COUNTS:  YES  TOURNIQUET:  * No tourniquets in log *  DICTATION: .Other Dictation: Dictation Number [id number]  PLAN OF CARE: Admit for overnight observation  PATIENT DISPOSITION:  PACU - hemodynamically stable.   Delay start of Pharmacological VTE agent (>24hrs) due to surgical blood loss or risk of bleeding: not applicable

## 2015-10-10 ENCOUNTER — Encounter (HOSPITAL_COMMUNITY): Payer: Self-pay | Admitting: Obstetrics and Gynecology

## 2015-10-10 DIAGNOSIS — N920 Excessive and frequent menstruation with regular cycle: Secondary | ICD-10-CM | POA: Diagnosis not present

## 2015-10-10 LAB — CBC
HCT: 36.6 % (ref 36.0–46.0)
Hemoglobin: 11.9 g/dL — ABNORMAL LOW (ref 12.0–15.0)
MCH: 27.6 pg (ref 26.0–34.0)
MCHC: 32.5 g/dL (ref 30.0–36.0)
MCV: 84.9 fL (ref 78.0–100.0)
PLATELETS: 265 10*3/uL (ref 150–400)
RBC: 4.31 MIL/uL (ref 3.87–5.11)
RDW: 13.8 % (ref 11.5–15.5)
WBC: 14.3 10*3/uL — AB (ref 4.0–10.5)

## 2015-10-10 MED ORDER — OXYCODONE-ACETAMINOPHEN 7.5-325 MG PO TABS
1.0000 | ORAL_TABLET | ORAL | Status: DC | PRN
  Administered 2015-10-10: 1 via ORAL
  Filled 2015-10-10: qty 1

## 2015-10-10 MED ORDER — OXYCODONE-ACETAMINOPHEN 7.5-325 MG PO TABS: 1.0000 | ORAL_TABLET | ORAL | Status: AC | PRN

## 2015-10-10 NOTE — Progress Notes (Signed)
Patient discharged home with significant other... Discharge instructions reviewed with patient and she verbalized understanding... Condition stable... No equipment... Taken to car via wheelchair by Santiago Bur, NT.

## 2015-10-10 NOTE — Discharge Summary (Signed)
  Patient name Gina Golden, Gina Golden DICTATION# [id number] CSN# [id number]  [facility], MD 10/10/2015 9:05 AM

## 2015-10-10 NOTE — Progress Notes (Signed)
1 Day Post-Op Procedure(s) (LRB): LAPAROSCOPIC ASSISTED VAGINAL HYSTERECTOMY (N/A) SALPINGO OOPHORECTOMY (Bilateral)  Subjective: Patient reports tolerating PO and no problems voiding.    Objective: I have reviewed patient's vital signs and labs.  General: alert GI: soft, non-tender; bowel sounds normal; no masses,  no organomegaly and incision: clean Vaginal Bleeding: minimal  Assessment: s/p Procedure(s): LAPAROSCOPIC ASSISTED VAGINAL HYSTERECTOMY (N/A) SALPINGO OOPHORECTOMY (Bilateral): stable  Plan: Discharge home     [facility] S 10/10/2015, 9:01 AM

## 2015-10-10 NOTE — Op Note (Signed)
Gina Golden, Gina Golden                ACCOUNT NO.:  [id number]  MEDICAL RECORD NO.:  [id number]  LOCATION:  9303                          FACILITY:  [facility]  PHYSICIAN:  Darlyn Chamber, M.D.   DATE OF BIRTH:  [birth year]  DATE OF PROCEDURE:  10/09/2015 DATE OF DISCHARGE:                              OPERATIVE REPORT   PREOPERATIVE DIAGNOSES:  Pelvic pain, dyspareunia, menorrhagia thought to be secondary to endometriosis.  POSTOPERATIVE DIAGNOSES:  Pelvic pain, dyspareunia, menorrhagia thought to be secondary to endometriosis.  OPERATIVE PROCEDURE:  Laparoscopic-assisted vaginal hysterectomy with bilateral salpingo-oophorectomy and cystoscopy.  SURGEON:  Darlyn Chamber, MD  ASSISTANT:  Lanna Poche.  ANESTHESIA:  General endotracheal.  ESTIMATED BLOOD LOSS:  400 mL.  PACKS:  None.  DRAINS:  Included urethral Foley.  INTRAOPERATIVE BLOOD PLACED:  None.  COMPLICATIONS:  None.  INDICATION:  Dictated in history and physical.  PROCEDURE IN DETAIL:  The patient was taken to the OR and placed in supine position.  After satisfactory level of general endotracheal anesthesia obtained, the patient was placed in dorsal lithotomy position using the Allen stirrups.  The perineum and vagina were prepped out with Betadine.  Speculum was placed in the vaginal vault.  The cervix was grasped with single-tooth tenaculum.  The Hulka tenaculum was put in place, secured.  Single-tooth tenaculum inspected and removed.  The abdomen was prepped with DuraPrep.  After appropriate time, she was draped in sterile field.  Subumbilical incision made with knife extended through the subcutaneous tissue.  Fascia was identified and entered sharply.  Peritoneum was entered sharply.  Open laparoscopic trocar was inserted and the abdomen was inflated with carbon dioxide.  Laparoscope was introduced.  There was no evidence of injury to adjacent organs.  A 5-mm trocar was put in place of suprapubic area.  The  patient was placed in the Trendelenburg position.  The uterus was normal size and shape. She had a small pedunculated fibroid.  Tubes and ovaries were unremarkable.  There was no evidence whatsoever of endometriosis or adhesions.  Appendix was visualized noted to be normal.  Upper abdomen including liver and tip of the gallbladder were clear.  Next, using the EnSeal, the right ovary was elevated.  The ureter was identified.  The right ovarian vasculature was cauterized and incised.  Mesenteric attachments of the tube and ovary were cauterized and incised up to the uterus, then the right round ligament was cauterized and incised.  We then went to the left side.  The left ovary was elevated.  Again we could see the path of ureter.  The left ovarian vasculature was cauterized and incised.  The mesenteric attachments, the tubes and ovaries were cauterized and incised up to the round ligament.  Then, the round ligament was cauterized, incised.  We had good hemostasis. Decision was to go vaginally.  The abdomen was re-inflated with carbon dioxide.  The Hulka tenaculum was then removed.  The patient's legs were repositioned.  A weighted speculum was placed in vaginal vault.  Cervix was grasped with Ardis Hughs tenaculum.  Cul-de-sac was entered sharply.  Both uterosacral ligaments were clamped, cut, and suture ligated with 0-Vicryl.  The reflection of  the vaginal mucosa anteriorly was incised using the Bovie.  We dissected the bladder superiorly.  Paracervical tissue was clamped, cut, and suture ligated with 0-Vicryl.  The vesicouterine space was entered sharply and retractors were put in place.  Using the clamp, cut, tie technique, suture ligature of 0 Vicryl, parametrium serially separated from the sides of uterus.  Uterus was then flipped.  Remaining pedicles were clamped and cut.  Uterus, tubes, and ovaries were passed off the operative field.  Pedicles secured free ties of 0-Vicryl.   Posterior vaginal cuff was run with a running interlocking suture of 0 Vicryl. Vaginal mucosa was reapproximated with interrupted figure-of-eights of 2- 0 Vicryl.  At this point in time, the patient's legs was reinflated with carbon dioxide.  Laparoscope was re-induced.  We irrigated the pelvis, had some minimal oozing from the cuff brought under control with the bipolar.  She had a little bit of oozing from the left pelvic brim, this was also cauterized with the Bovie.  We thoroughly irrigated the pelvis. We had good hemostasis.  We deflated the abdomen and reinflated.  No bleeding was encountered.  The abdomen was deflated with carbon dioxide. All trocars were removed.  Subumbilical fascia was closed with 2 figure- of-eights of 0 Vicryl.  Skin was closed with interrupted subcuticulars of 2-0 Vicryl.  Suprapubic incision closed with Dermabond.  Legs were repositioned.  Cystoscope was introduced.  Patient began on fluorescein.  We saw green-colored jets coming from both ureters.  The bladder was completely intact.  Cystoscope was then removed.  Foley was placed to straight drain.  The patient taken out of the dorsal lithotomy position.  Once alert and extubated, transferred to recovery in good condition.  Sponge, instrument, and needle count was correct by circulating nurse x3.     Darlyn Chamber, M.D.     JSM/MEDQ  D:  10/09/2015  T:  10/10/2015  Job:  [id number]

## 2015-10-10 NOTE — Progress Notes (Signed)
13cc fentanyl wasted in medication room sink Bess Kinds RN witnessed

## 2015-10-11 NOTE — Discharge Summary (Signed)
Gina Golden, Gina Golden                ACCOUNT NO.:  [id number]  MEDICAL RECORD NO.:  [id number]  LOCATION:  9303                          FACILITY:  [facility]  PHYSICIAN:  Darlyn Chamber, M.D.   DATE OF BIRTH:  1976-03-13  DATE OF ADMISSION:  10/09/2015 DATE OF DISCHARGE:  10/10/2015                              DISCHARGE SUMMARY   ADMITTING DIAGNOSIS:  Pelvic pain and painful intercourse with abnormal bleeding thought to be secondary to endometriosis.  POSTOPERATIVE DIAGNOSIS:  Pelvic pain and painful intercourse with abnormal bleeding thought to be secondary to endometriosis.  No evidence of pelvic endometriosis.  OPERATIVE PROCEDURE:  Laparoscopic-assisted vaginal hysterectomy, bilateral salpingo-oophorectomy, cystoscopy.  For complete history and physical, please see dictated note.  COURSE IN THE HOSPITAL:  The patient underwent open laparoscopy.  This revealed uterus, tubes, and ovaries were unremarkable.  There were no adhesions or active endometriosis.  Appendix was normal.  Upper abdomen including liver and tip of the gallbladder were clear.  I performed a laparoscopic-assisted vaginal hysterectomy with removal of both tubes and ovaries.  After the procedure, cystoscopy was performed.  Both ureteral orifice were spilling fluorescein tented urine. Postoperatively, the patient did well.  Postop, hemoglobin was 11.9.  On her 1st postop day, she was afebrile.  Tolerated her diet.  Her Foley had been discontinued.  She was able to void.  Her abdomen was soft and nontender.  Incisions were clear.  She had minimal spotting.  In terms of complications, none encountered during the stay in the hospital.  The patient is discharged home in stable condition.  DISPOSITION:  The patient to avoid heavy lifting, vaginal entry, or driving a car.  She will call the office should there be signs of infection.  Heavy bleeding should be reported.  Nausea, vomiting should be reported.  Excessive pain  should be reported.  Also instructed signs and symptoms of deep venous thrombosis and pulmonary embolus.  She will be discharged home with Percocet as needed for pain.  PLAN:  Followup in the office in approximately 1 week.     Darlyn Chamber, M.D.     JSM/MEDQ  D:  10/10/2015  T:  10/11/2015  Job:  [id number]

## 2017-03-21 ENCOUNTER — Telehealth (INDEPENDENT_AMBULATORY_CARE_PROVIDER_SITE_OTHER): Payer: Self-pay | Admitting: Rheumatology

## 2017-03-21 NOTE — Telephone Encounter (Signed)
I emailed patient medical records release form to complete at [email or web address] per her request

## 2017-04-07 ENCOUNTER — Telehealth (INDEPENDENT_AMBULATORY_CARE_PROVIDER_SITE_OTHER): Payer: Self-pay | Admitting: Rheumatology

## 2017-04-07 NOTE — Telephone Encounter (Signed)
COPY OF RECORDS MAILED TO PATIENT °

## 2017-04-07 NOTE — Telephone Encounter (Signed)
[facility] RECORDS  FAXED TO [facility] [phone]

## 2017-04-18 NOTE — Telephone Encounter (Signed)
Patient called stating she still had not received medical records and needs them Wednesday. I told her that I mailed them to her 9/50 and certainly she wouldve received them by now. She gave me her fax number and I've faxed [facility] records to her [phone]

## 2017-09-19 ENCOUNTER — Encounter (HOSPITAL_COMMUNITY): Payer: Self-pay
